# Patient Record
Sex: Female | Born: 1980 | Race: White | Hispanic: No | Marital: Married | Smoking: Former smoker
Health system: Southern US, Community
[De-identification: ages and names within clinical notes are randomized; demographics above are authoritative.]

## PROBLEM LIST (undated history)

## (undated) ENCOUNTER — Inpatient Hospital Stay (HOSPITAL_COMMUNITY): Payer: Self-pay

## (undated) DIAGNOSIS — N83209 Unspecified ovarian cyst, unspecified side: Secondary | ICD-10-CM

## (undated) DIAGNOSIS — D219 Benign neoplasm of connective and other soft tissue, unspecified: Secondary | ICD-10-CM

## (undated) HISTORY — PX: APPENDECTOMY: SHX54

---
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
## 1999-11-21 ENCOUNTER — Other Ambulatory Visit: Admission: RE | Admit: 1999-11-21 | Discharge: 1999-11-21 | Payer: Self-pay | Admitting: Gynecology

## 2000-12-25 ENCOUNTER — Other Ambulatory Visit: Admission: RE | Admit: 2000-12-25 | Discharge: 2000-12-25 | Payer: Self-pay | Admitting: Gynecology

## 2001-08-23 ENCOUNTER — Encounter: Payer: Self-pay | Admitting: *Deleted

## 2001-08-23 ENCOUNTER — Ambulatory Visit (HOSPITAL_COMMUNITY): Admission: RE | Admit: 2001-08-23 | Discharge: 2001-08-23 | Payer: Self-pay | Admitting: *Deleted

## 2001-12-31 ENCOUNTER — Other Ambulatory Visit: Admission: RE | Admit: 2001-12-31 | Discharge: 2001-12-31 | Payer: Self-pay | Admitting: Gynecology

## 2003-01-20 ENCOUNTER — Other Ambulatory Visit: Admission: RE | Admit: 2003-01-20 | Discharge: 2003-01-20 | Payer: Self-pay | Admitting: Gynecology

## 2004-09-23 ENCOUNTER — Other Ambulatory Visit: Admission: RE | Admit: 2004-09-23 | Discharge: 2004-09-23 | Payer: Self-pay | Admitting: Gynecology

## 2005-08-12 ENCOUNTER — Other Ambulatory Visit: Admission: RE | Admit: 2005-08-12 | Discharge: 2005-08-12 | Payer: Self-pay | Admitting: Gynecology

## 2006-02-17 ENCOUNTER — Inpatient Hospital Stay (HOSPITAL_COMMUNITY): Admission: RE | Admit: 2006-02-17 | Discharge: 2006-02-20 | Payer: Self-pay | Admitting: Gynecology

## 2006-03-17 ENCOUNTER — Other Ambulatory Visit: Admission: RE | Admit: 2006-03-17 | Discharge: 2006-03-17 | Payer: Self-pay | Admitting: Family Medicine

## 2007-08-30 ENCOUNTER — Other Ambulatory Visit: Admission: RE | Admit: 2007-08-30 | Discharge: 2007-08-30 | Payer: Self-pay | Admitting: Family Medicine

## 2008-02-18 ENCOUNTER — Ambulatory Visit: Payer: Self-pay | Admitting: Gynecology

## 2008-02-21 ENCOUNTER — Ambulatory Visit: Payer: Self-pay | Admitting: Gynecology

## 2008-03-10 ENCOUNTER — Ambulatory Visit: Payer: Self-pay | Admitting: Gynecology

## 2008-04-10 ENCOUNTER — Ambulatory Visit: Payer: Self-pay | Admitting: Gynecology

## 2009-04-26 ENCOUNTER — Other Ambulatory Visit: Admission: RE | Admit: 2009-04-26 | Discharge: 2009-04-26 | Payer: Self-pay | Admitting: Gynecology

## 2009-04-26 ENCOUNTER — Ambulatory Visit: Payer: Self-pay | Admitting: Gynecology

## 2009-05-04 ENCOUNTER — Ambulatory Visit: Payer: Self-pay | Admitting: Gynecology

## 2009-05-08 ENCOUNTER — Ambulatory Visit: Payer: Self-pay | Admitting: Gynecology

## 2009-06-04 ENCOUNTER — Ambulatory Visit: Payer: Self-pay | Admitting: Gynecology

## 2009-09-05 ENCOUNTER — Ambulatory Visit: Payer: Self-pay | Admitting: Gynecology

## 2009-10-08 ENCOUNTER — Ambulatory Visit: Payer: Self-pay | Admitting: Gynecology

## 2009-10-12 ENCOUNTER — Ambulatory Visit: Payer: Self-pay | Admitting: Gynecology

## 2009-12-11 HISTORY — PX: INTRAUTERINE DEVICE INSERTION: SHX323

## 2009-12-20 ENCOUNTER — Ambulatory Visit: Payer: Self-pay | Admitting: Gynecology

## 2010-01-21 ENCOUNTER — Ambulatory Visit: Payer: Self-pay | Admitting: Gynecology

## 2010-06-28 NOTE — Discharge Summary (Signed)
Chelsea Lam, Chelsea Lam             ACCOUNT NO.:  [id number]   MEDICAL RECORD NO.:  [id number]          PATIENT TYPE:  INP   LOCATION:  9135                          FACILITY:  [facility]   PHYSICIAN:  Juan H. Lily Peer, M.D.DATE OF BIRTH:  1980/08/18   DATE OF ADMISSION:  02/17/2006  DATE OF DISCHARGE:  02/20/2006                               DISCHARGE SUMMARY   TOTAL DAYS HOSPITALIZED:  Three.   HISTORY:  The patient is a 30 year old gravida 1, para 0 at 39-1/2 weeks  estimated gestational age, who had been admitted to the hospital  secondary to suspicious for LGA baby.  The patient had been on Pitocin  all day with no change in her cervix and the head was not engaged and  suspected cephalopelvic disproportion and also, she had a reassuring  fetal heart rate tracing and was taken for primary cesarean section by  Dr. Ok Edwards.  The patient delivered a viable female infant via  primary lower uterine segment transverse cesarean section.  She  delivered a viable female infant, Apgars of 8 and 9 with a weight of 8  pounds 13 ounces and arterial cord pH of 7.19.  The patient had a small  right lower uterine segment hematoma that was contained during the  operation and did well.  On postoperative day #1, her hemoglobin was  8.9, hematocrit 25%, platelet count 174,000.  She is O positive, rubella  immune.  She was passing flatus.  Her Foley catheter was discontinued  and she was started on oral Percocet and her diet was advanced from  clear to a regular diet.  She continued to ambulate and do well and was  ready to be discharged home on her 3rd postoperative day.  She was  passing flatus and tolerating a regular diet.   FINAL DIAGNOSES:  1. Term intrauterine pregnancy, delivered.  2. Cephalopelvic disproportion.  3. Failed induction.  4. Anemia.   PROCEDURES PERFORMED:  1. Induction via Cervidil and Pitocin.  2. Primary lower uterine segment transverse cesarean section.   FINAL  DISPOSITION AND FOLLOWUP:  The patient was discharged home on her  3rd postoperative day.  She was up and ambulating and tolerating a  regular diet well.  Her incision was intact.  The staples were to be  removed and incision steri-stripped.  She will be discharged home on  prenatal vitamins and iron as well as a prescription for Tylox to take  one p.o. q.4-6 h. p.r.n. pain and discharge instruction were provided.  She is to follow up in the office in 4 weeks for a postpartum visit.      Juan H. Lily Peer, M.D.  Electronically Signed     JHF/MEDQ  D:  02/20/2006  T:  02/20/2006  Job:  [id number]

## 2010-06-28 NOTE — Op Note (Signed)
NAMEMCKENLEE, MANGHAM             ACCOUNT NO.:  [id number]   MEDICAL RECORD NO.:  [id number]          PATIENT TYPE:  INP   LOCATION:  9135                          FACILITY:  [facility]   PHYSICIAN:  Juan H. Lily Peer, M.D.DATE OF BIRTH:  1981/01/05   DATE OF PROCEDURE:  02/17/2006  DATE OF DISCHARGE:                               OPERATIVE REPORT   SURGEON:  Juan H. Lily Peer, M.D.   INDICATIONS FOR OPERATION:  30 year old gravida 1, para 0, at 27 1/2  weeks' estimated gestational age was admitted for induction secondary to  LGA baby. The patient was with Pitocin all day long with no change in  her cervix, long and closed, head not engaged, suspected cephalopelvic  disproportion, reassuring fetal heart rate tracing.   PREOPERATIVE DIAGNOSIS:  1. Term intrauterine pregnancy.  2. Suspected fetal macrosomia.  3. Failed induction.  4. Cephalopelvic disproportion.   POSTOPERATIVE DIAGNOSIS:  1. Term intrauterine pregnancy.  2. Suspected fetal macrosomia.  3. Failed induction.  4. Cephalopelvic disproportion.   ANESTHESIA:  Epidural.   PROCEDURE PERFORMED:  Primary lower uterine segment transverse cesarean  section.   FINDINGS:  Viable female infant, Apgars of 8/9, with a weight of 8 pounds  13 ounces.  Arterial cord pH of 7.19.   COMPLICATIONS:  Right lower uterine segment hematoma.   DESCRIPTION OF OPERATION:  After the patient adequately counseled, she  was taken to the operating room where she underwent placement of an  epidural catheter.  She was in the supine position and the abdomen  prepped and draped in the usual sterile fashion.  A Foley catheter was  inserted to monitor urinary output.  After the drapes were in place, a  Pfannenstiel skin incision was made 2 cm above the symphysis pubis.  Incision was carried out through the skin, subcutaneous tissue, down to  the rectus fascia whereby a midline nick was made.  The fascia was  incised in a transverse fashion.  The  peritoneal cavity was entered  cautiously and the lower uterine segment was incised in a transverse  fashion.  Clear amniotic fluid was present.  The newborn was delivered.  The cord was doubly clamped and excised.  The nasopharyngeal area was  bulb suctioned.  The newborn was passed off to the neonatologist after  being shown to the parents. The newborn gave immediate cry.  Apgars were  8/9, weight 8 pounds 13 ounces.  Arterial cord pH 7.19. Normal maternal  pelvic anatomy.  Pitocin drip was started.  The patient received a gram  of cefoxitin IV for prophylaxis.  The uterus was exteriorized.  The  intrauterine cavity was swept clear of remaining products of conception.  The lower uterine segment transverse incision was closed in a locking  stitch manner followed by an imbricating manner with 0 Vicryl suture.  A  right lower uterine segment hematoma was present and direct pressure for  approximately ten minutes was applied to the area and was contained. In  addition to the transverse incision, Surgicel was placed for additional  hemostasis.  The uterus, tubes, and ovaries were normal.  The uterus was  placed back in the pelvic cavity.  Of note, prior to placement of the  Surgicel, the pelvic cavity was copiously irrigated with normal saline  solution and once again, it was ascertained there was adequate  hemostasis and the hematoma was not expanding.  The visceral peritoneum  was not reapproximated but the rectus fascia was closed with a running  stitch of 0 Vicryl suture.  The subcutaneous bleeders were Bovie  cauterized.  The skin was reapproximated with skin clips followed by  placing Xeroform gauze and 4 by 4 dressing.  The patient was transferred  to the recovery room with stable vital signs.  Blood loss from the  procedure was 1 liter. IV fluids 3800 mL lactated Ringer's.  Urine  output 400 mL.      Juan H. Lily Peer, M.D.  Electronically Signed     JHF/MEDQ  D:  02/17/2006   T:  02/17/2006  Job:  [id number]

## 2010-08-16 ENCOUNTER — Encounter: Payer: Self-pay | Admitting: *Deleted

## 2010-12-09 ENCOUNTER — Ambulatory Visit (INDEPENDENT_AMBULATORY_CARE_PROVIDER_SITE_OTHER): Payer: PRIVATE HEALTH INSURANCE | Admitting: Gynecology

## 2010-12-09 ENCOUNTER — Encounter: Payer: Self-pay | Admitting: Gynecology

## 2010-12-09 DIAGNOSIS — A499 Bacterial infection, unspecified: Secondary | ICD-10-CM

## 2010-12-09 DIAGNOSIS — Z30431 Encounter for routine checking of intrauterine contraceptive device: Secondary | ICD-10-CM

## 2010-12-09 DIAGNOSIS — N938 Other specified abnormal uterine and vaginal bleeding: Secondary | ICD-10-CM

## 2010-12-09 DIAGNOSIS — N760 Acute vaginitis: Secondary | ICD-10-CM

## 2010-12-09 DIAGNOSIS — N898 Other specified noninflammatory disorders of vagina: Secondary | ICD-10-CM

## 2010-12-09 DIAGNOSIS — B3731 Acute candidiasis of vulva and vagina: Secondary | ICD-10-CM

## 2010-12-09 DIAGNOSIS — B373 Candidiasis of vulva and vagina: Secondary | ICD-10-CM

## 2010-12-09 DIAGNOSIS — N949 Unspecified condition associated with female genital organs and menstrual cycle: Secondary | ICD-10-CM

## 2010-12-09 DIAGNOSIS — B9689 Other specified bacterial agents as the cause of diseases classified elsewhere: Secondary | ICD-10-CM

## 2010-12-09 MED ORDER — METRONIDAZOLE 500 MG PO TABS: 500.0000 mg | ORAL_TABLET | Freq: Two times a day (BID) | ORAL | Status: AC

## 2010-12-09 MED ORDER — FLUCONAZOLE 150 MG PO TABS: 150.0000 mg | ORAL_TABLET | Freq: Once | ORAL | Status: AC

## 2010-12-09 NOTE — Progress Notes (Signed)
Patient presents complaining of vaginal discharge with odor. She's not sure if she had left a tampon in place. She has IUD notes she's had 2 light menses this past cycle which is unusual normally has just one light menses per month. Not having pain fevers or other symptoms.  Exam Abdomen soft nontender masses guarding rebound organomegaly Pelvic external BUS vagina with abundant white discharge, cervix normal IUD string visualized no evidence of retained tampon uterus normal size midline mobile nontender adnexa without masses or tenderness  Assessment and plan: KOH wet prep is positive for yeast and BV. No evidence of retained tampon. We'll treat with Diflucan 150x1 dose and Flagyl 500 twice a day x7 days the avoidance reviewed. Will keep menstrual calendar as long as resumes regular menses we'll follow if persistent abnormal bleeding or other symptoms or recurrence of her vaginal discharge she'll represent for further evaluation.

## 2010-12-24 ENCOUNTER — Ambulatory Visit (INDEPENDENT_AMBULATORY_CARE_PROVIDER_SITE_OTHER): Payer: PRIVATE HEALTH INSURANCE | Admitting: Gynecology

## 2010-12-24 ENCOUNTER — Encounter: Payer: Self-pay | Admitting: Gynecology

## 2010-12-24 ENCOUNTER — Other Ambulatory Visit: Payer: Self-pay | Admitting: Gynecology

## 2010-12-24 ENCOUNTER — Ambulatory Visit (INDEPENDENT_AMBULATORY_CARE_PROVIDER_SITE_OTHER): Payer: PRIVATE HEALTH INSURANCE

## 2010-12-24 VITALS — BP 128/78

## 2010-12-24 DIAGNOSIS — R102 Pelvic and perineal pain unspecified side: Secondary | ICD-10-CM

## 2010-12-24 DIAGNOSIS — N938 Other specified abnormal uterine and vaginal bleeding: Secondary | ICD-10-CM

## 2010-12-24 DIAGNOSIS — N949 Unspecified condition associated with female genital organs and menstrual cycle: Secondary | ICD-10-CM

## 2010-12-24 DIAGNOSIS — B373 Candidiasis of vulva and vagina: Secondary | ICD-10-CM

## 2010-12-24 DIAGNOSIS — T8339XA Other mechanical complication of intrauterine contraceptive device, initial encounter: Secondary | ICD-10-CM

## 2010-12-24 DIAGNOSIS — IMO0001 Reserved for inherently not codable concepts without codable children: Secondary | ICD-10-CM

## 2010-12-24 DIAGNOSIS — IMO0002 Reserved for concepts with insufficient information to code with codable children: Secondary | ICD-10-CM

## 2010-12-24 DIAGNOSIS — N921 Excessive and frequent menstruation with irregular cycle: Secondary | ICD-10-CM

## 2010-12-24 DIAGNOSIS — N83209 Unspecified ovarian cyst, unspecified side: Secondary | ICD-10-CM

## 2010-12-24 DIAGNOSIS — D3910 Neoplasm of uncertain behavior of unspecified ovary: Secondary | ICD-10-CM

## 2010-12-24 DIAGNOSIS — N83201 Unspecified ovarian cyst, right side: Secondary | ICD-10-CM

## 2010-12-24 DIAGNOSIS — Z113 Encounter for screening for infections with a predominantly sexual mode of transmission: Secondary | ICD-10-CM

## 2010-12-24 DIAGNOSIS — N941 Unspecified dyspareunia: Secondary | ICD-10-CM

## 2010-12-24 DIAGNOSIS — Z975 Presence of (intrauterine) contraceptive device: Secondary | ICD-10-CM

## 2010-12-24 DIAGNOSIS — R1903 Right lower quadrant abdominal swelling, mass and lump: Secondary | ICD-10-CM

## 2010-12-24 DIAGNOSIS — B3731 Acute candidiasis of vulva and vagina: Secondary | ICD-10-CM

## 2010-12-24 DIAGNOSIS — N925 Other specified irregular menstruation: Secondary | ICD-10-CM

## 2010-12-24 DIAGNOSIS — N898 Other specified noninflammatory disorders of vagina: Secondary | ICD-10-CM

## 2010-12-24 MED ORDER — TERCONAZOLE 0.8 % VA CREA: 1.0000 | TOPICAL_CREAM | Freq: Every day | VAGINAL | Status: AC

## 2010-12-24 MED ORDER — MEGESTROL ACETATE 40 MG PO TABS: 40.0000 mg | ORAL_TABLET | Freq: Two times a day (BID) | ORAL | Status: AC
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
## 2013-02-25 LAB — OB RESULTS CONSOLE ABO/RH: RH Type: POSITIVE

## 2013-05-27 ENCOUNTER — Encounter: Payer: Self-pay | Admitting: Women's Health

## 2013-05-27 ENCOUNTER — Ambulatory Visit (INDEPENDENT_AMBULATORY_CARE_PROVIDER_SITE_OTHER): Payer: No Typology Code available for payment source | Admitting: Women's Health

## 2013-05-27 DIAGNOSIS — Z309 Encounter for contraceptive management, unspecified: Secondary | ICD-10-CM

## 2013-05-27 DIAGNOSIS — Z113 Encounter for screening for infections with a predominantly sexual mode of transmission: Secondary | ICD-10-CM

## 2013-05-27 DIAGNOSIS — IMO0001 Reserved for inherently not codable concepts without codable children: Secondary | ICD-10-CM

## 2013-05-27 MED ORDER — NORETHIN ACE-ETH ESTRAD-FE 1-20 MG-MCG PO TABS: 1.0000 | ORAL_TABLET | Freq: Every day | ORAL | Status: DC

## 2013-05-27 NOTE — Addendum Note (Signed)
Addended by: Thamas Jaegers on: 05/27/2013 02:44 PM   Modules accepted: Orders

## 2013-05-27 NOTE — Progress Notes (Signed)
Patient ID: Mateo Flow L. Langone, female   DOB: Sep 13, 1980, 33 y.o.   MRN: [id number] Presents requesting IUD to be removed, placed in 2011. Irregular light bleeding since placed. States has had bleeding since 05/11/2013 several times heavy gushing of blood changing pad and tampon every hour. Denies any vaginal discharge, abdominal pain, fever, or urinary symptoms. New partner. Would like to start on birth control pills. Had been on in the past without problem. Nonsmoker. Overdue for annual, history of normal Paps. States had a negative  UPT x2. States has not been in, loss of insurance.  Exam: Appears well.  Abdomen soft nontender no rebound or radiation of pain.  External genitalia within normal limits.Marland Kitchen Speculum exam IUD strings visible, no visible blood, GC/Chlamydia culture taken. IUD string grasped with ring forceps removed intact, shown to patient, discarded. Bimanual, no CMT or adnexal fullness or tenderness.  Irregular bleeding with Mirena IUD Contraception management  Plan:  Conception options reviewed. Will try Loestrin 1/20 prescription, proper use, slight risk for blood clots and strokes reviewed. 1 refill only, instructed to schedule annual exam and will complete STD screen. Condoms encouraged first month.  Instructed to call if heavy bleeding reoccurs. GC Chlamydia culture pending.

## 2013-05-28 LAB — GC/CHLAMYDIA PROBE AMP
CT PROBE, AMP APTIMA: NEGATIVE
GC PROBE AMP APTIMA: NEGATIVE

## 2013-05-29 ENCOUNTER — Inpatient Hospital Stay (HOSPITAL_COMMUNITY)
Admission: AD | Admit: 2013-05-29 | Discharge: 2013-05-29 | Disposition: A | Discharge status: Home / Self Care | Payer: No Typology Code available for payment source | Source: Ambulatory Visit | Attending: Obstetrics and Gynecology | Admitting: Obstetrics and Gynecology

## 2013-05-29 ENCOUNTER — Inpatient Hospital Stay (HOSPITAL_COMMUNITY): Payer: No Typology Code available for payment source

## 2013-05-29 ENCOUNTER — Encounter (HOSPITAL_COMMUNITY): Payer: Self-pay | Admitting: Family

## 2013-05-29 DIAGNOSIS — Z87891 Personal history of nicotine dependence: Secondary | ICD-10-CM | POA: Insufficient documentation

## 2013-05-29 DIAGNOSIS — N938 Other specified abnormal uterine and vaginal bleeding: Secondary | ICD-10-CM | POA: Insufficient documentation

## 2013-05-29 DIAGNOSIS — N949 Unspecified condition associated with female genital organs and menstrual cycle: Secondary | ICD-10-CM | POA: Insufficient documentation

## 2013-05-29 DIAGNOSIS — N898 Other specified noninflammatory disorders of vagina: Secondary | ICD-10-CM

## 2013-05-29 DIAGNOSIS — N939 Abnormal uterine and vaginal bleeding, unspecified: Secondary | ICD-10-CM

## 2013-05-29 DIAGNOSIS — D250 Submucous leiomyoma of uterus: Secondary | ICD-10-CM

## 2013-05-29 DIAGNOSIS — N925 Other specified irregular menstruation: Secondary | ICD-10-CM | POA: Insufficient documentation

## 2013-05-29 LAB — CBC
HEMATOCRIT: 36.9 % (ref 36.0–46.0)
Hemoglobin: 12.9 g/dL (ref 12.0–15.0)
MCH: 33.5 pg (ref 26.0–34.0)
MCHC: 35 g/dL (ref 30.0–36.0)
MCV: 95.8 fL (ref 78.0–100.0)
Platelets: 208 10*3/uL (ref 150–400)
RBC: 3.85 MIL/uL — ABNORMAL LOW (ref 3.87–5.11)
RDW: 12.6 % (ref 11.5–15.5)
WBC: 6 10*3/uL (ref 4.0–10.5)

## 2013-05-29 LAB — URINALYSIS, ROUTINE W REFLEX MICROSCOPIC
BILIRUBIN URINE: NEGATIVE
Glucose, UA: NEGATIVE mg/dL
Ketones, ur: NEGATIVE mg/dL
Leukocytes, UA: NEGATIVE
Nitrite: NEGATIVE
Protein, ur: NEGATIVE mg/dL
Specific Gravity, Urine: 1.01 (ref 1.005–1.030)
UROBILINOGEN UA: 0.2 mg/dL (ref 0.0–1.0)
pH: 5.5 (ref 5.0–8.0)

## 2013-05-29 LAB — URINE MICROSCOPIC-ADD ON

## 2013-05-29 LAB — POCT PREGNANCY, URINE: Preg Test, Ur: NEGATIVE

## 2013-05-29 MED ORDER — KETOROLAC TROMETHAMINE 60 MG/2ML IM SOLN
60.0000 mg | Freq: Once | INTRAMUSCULAR | Status: AC
  Administered 2013-05-29: 60 mg via INTRAMUSCULAR
  Filled 2013-05-29: qty 2

## 2013-05-29 NOTE — Discharge Instructions (Signed)
Abnormal Uterine Bleeding °Abnormal uterine bleeding means bleeding from the vagina that is not your normal menstrual period. This can be: °· Bleeding or spotting between periods. °· Bleeding after sex (sexual intercourse). °· Bleeding that is heavier or more than normal. °· Periods that last longer than usual. °· Bleeding after menopause. °There are many problems that may cause this. Treatment will depend on the cause of the bleeding. Any kind of bleeding that is not normal should be reviewed by your doctor.  °HOME CARE °Watch your condition for any changes. These actions may lessen any discomfort you are having: °· Do not use tampons or douches as told by your doctor. °· Change your pads often. °You should get regular pelvic exams and Pap tests. Keep all appointments for tests as told by your doctor. °GET HELP IF: °· You are bleeding for more than 1 week. °· You feel dizzy at times. °GET HELP RIGHT AWAY IF:  °· You pass out. °· You have to change pads every 15 to 30 minutes. °· You have belly pain. °· You have a fever. °· You become sweaty or weak. °· You are passing large blood clots from the vagina. °· You feel sick to your stomach (nauseous) and throw up (vomit). °MAKE SURE YOU: °· Understand these instructions. °· Will watch your condition. °· Will get help right away if you are not doing well or get worse. °Document Released: 11/24/2008 Document Revised: 11/17/2012 Document Reviewed: 08/26/2012 °ExitCare® Patient Information ©2014 [employer]. ° °Fibroids °Fibroids are lumps (tumors) that can occur any place in a woman's body. These lumps are not cancerous. Fibroids vary in size, weight, and where they grow. °HOME CARE °· Do not take aspirin. °· Write down the number of pads or tampons you use during your period. Tell your doctor. This can help determine the best treatment for you. °GET HELP RIGHT AWAY IF: °· You have pain in your lower belly (abdomen) that is not helped with medicine. °· You have cramps  that are not helped with medicine. °· You have more bleeding between or during your period. °· You feel lightheaded or pass out (faint). °· Your lower belly pain gets worse. °MAKE SURE YOU: °· Understand these instructions. °· Will watch your condition. °· Will get help right away if you are not doing well or get worse. °Document Released: 03/01/2010 Document Revised: 04/21/2011 Document Reviewed: 03/01/2010 °Exit[employer]® Patient Information ©2014 [employer]. ° °

## 2013-05-29 NOTE — MAU Provider Note (Signed)
History     CSN: [id number]  Arrival date and time: 05/29/13 1345   First Provider Initiated Contact with Patient 05/29/13 1625      Chief Complaint  Patient presents with  . Vaginal Bleeding   HPI  [name] is a 33 y.o. female who presents with heavy vaginal bleeding. She has been bleeding since April 1st; she was last seen in the Dr. Gabriel Lam on Friday. She had her mirena IUD removed at that time. The patient was instructed to come to MAU if bleeding/pain became worse. She is bleeding heavily and is changing several pads per day.   OB History   Grav Para Term Preterm Abortions TAB SAB Ect Mult Living                  History reviewed. No pertinent past medical history.  Past Surgical History  Procedure Laterality Date  . Cesarean section  2008  . Knee reconstruction, medial patellar femoral ligament  1999  . Appendectomy    . Intrauterine device insertion  12/2009    mirena    Family History  Problem Relation Age of Onset  . Hypertension Mother     History  Substance Use Topics  . Smoking status: Former Research scientist (life sciences)  . Smokeless tobacco: Not on file  . Alcohol Use:     Allergies:  Allergies  Allergen Reactions  . Tetracyclines & Related Hives and Rash    Prescriptions prior to admission  Medication Sig Dispense Refill  . clonazePAM (KLONOPIN) 2 MG tablet Take 2 mg by mouth 2 (two) times daily as needed for anxiety.      . norethindrone-ethinyl estradiol (JUNEL FE,GILDESS FE,LOESTRIN FE) 1-20 MG-MCG tablet Take 1 tablet by mouth daily.  1 Package  2  . VYVANSE 50 MG capsule Take 50 mg by mouth daily.       Results for orders placed during the hospital encounter of 05/29/13 (from the past 48 hour(s))  CBC     Status: Abnormal   Collection Time    05/29/13  2:00 PM      Result Value Ref Range   WBC 6.0  4.0 - 10.5 K/uL   RBC 3.85 (*) 3.87 - 5.11 MIL/uL   Hemoglobin 12.9  12.0 - 15.0 g/dL   HCT 36.9  36.0 - 46.0 %   MCV 95.8  78.0 - 100.0 fL    MCH 33.5  26.0 - 34.0 pg   MCHC 35.0  30.0 - 36.0 g/dL   RDW 12.6  11.5 - 15.5 %   Platelets 208  150 - 400 K/uL  URINALYSIS, ROUTINE W REFLEX MICROSCOPIC     Status: Abnormal   Collection Time    05/29/13  2:10 PM      Result Value Ref Range   Color, Urine YELLOW  YELLOW   APPearance CLEAR  CLEAR   Specific Gravity, Urine 1.010  1.005 - 1.030   pH 5.5  5.0 - 8.0   Glucose, UA NEGATIVE  NEGATIVE mg/dL   Hgb urine dipstick SMALL (*) NEGATIVE   Bilirubin Urine NEGATIVE  NEGATIVE   Ketones, ur NEGATIVE  NEGATIVE mg/dL   Protein, ur NEGATIVE  NEGATIVE mg/dL   Urobilinogen, UA 0.2  0.0 - 1.0 mg/dL   Nitrite NEGATIVE  NEGATIVE   Leukocytes, UA NEGATIVE  NEGATIVE  URINE MICROSCOPIC-ADD ON     Status: Abnormal   Collection Time    05/29/13  2:10 PM      Result Value Ref  Range   Squamous Epithelial / LPF FEW (*) RARE   WBC, UA 0-2  <3 WBC/hpf   RBC / HPF 0-2  <3 RBC/hpf   Bacteria, UA FEW (*) RARE  POCT PREGNANCY, URINE     Status: None   Collection Time    05/29/13  2:47 PM      Result Value Ref Range   Preg Test, Ur NEGATIVE  NEGATIVE   Comment:            THE SENSITIVITY OF THIS     METHODOLOGY IS >24 mIU/mL   US Transvaginal Non-ob  05/29/2013   CLINICAL DATA:  Dysfunctional uterine bleeding. Recent IUD removal 2 days ago.  EXAM: TRANSABDOMINAL AND TRANSVAGINAL ULTRASOUND OF PELVIS  TECHNIQUE: Both transabdominal and transvaginal ultrasound examinations of the pelvis were performed. Transabdominal technique was performed for global imaging of the pelvis including uterus, ovaries, adnexal regions, and pelvic cul-de-sac. It was necessary to proceed with endovaginal exam following the transabdominal exam to visualize the ovaries and endometrium.  COMPARISON:  No priors.  FINDINGS: Uterus  Measurements: 9.6 x 4.9 x 5.6 cm. In the fundal portion of the uterus extending into the endometrial canal there is a 1.7 x 1.2 x 1.6 cm lesion of heterogeneous echotexture which is mixed hypoechoic  and isoechoic, most compatible with a submucosal fibroid.  Endometrium  Thickness: 16 mm. Small amount small amount of fluid in the endometrial canal.  Right ovary  Measurements: 3.5 x 1.8 x 1.8 cm. Normal appearance/no adnexal mass.  Left ovary  Measurements: 4.1 x 2.1 x 2.9 cm. Normal appearance/no adnexal mass.  Other findings  No free fluid.  IMPRESSION: 1. 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 2. Small volume of blood in the endometrial canal.   Electronically Signed   By: Vinnie Langton M.D.   On: 05/29/2013 16:16   US Pelvis Complete  05/29/2013   CLINICAL DATA:  Dysfunctional uterine bleeding. Recent IUD removal 2 days ago.  EXAM: TRANSABDOMINAL AND TRANSVAGINAL ULTRASOUND OF PELVIS  TECHNIQUE: Both transabdominal and transvaginal ultrasound examinations of the pelvis were performed. Transabdominal technique was performed for global imaging of the pelvis including uterus, ovaries, adnexal regions, and pelvic cul-de-sac. It was necessary to proceed with endovaginal exam following the transabdominal exam to visualize the ovaries and endometrium.  COMPARISON:  No priors.  FINDINGS: Uterus  Measurements: 9.6 x 4.9 x 5.6 cm. In the fundal portion of the uterus extending into the endometrial canal there is a 1.7 x 1.2 x 1.6 cm lesion of heterogeneous echotexture which is mixed hypoechoic and isoechoic, most compatible with a submucosal fibroid.  Endometrium  Thickness: 16 mm. Small amount small amount of fluid in the endometrial canal.  Right ovary  Measurements: 3.5 x 1.8 x 1.8 cm. Normal appearance/no adnexal mass.  Left ovary  Measurements: 4.1 x 2.1 x 2.9 cm. Normal appearance/no adnexal mass.  Other findings  No free fluid.  IMPRESSION: 1. 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 2. Small volume of blood in the endometrial canal.   Electronically Signed   By:  Vinnie Langton M.D.   On: 05/29/2013 16:16    Review of Systems  Constitutional: Negative for fever and chills.  Gastrointestinal: Positive for abdominal pain (abdominal cramping ).  Genitourinary: Negative for dysuria, urgency, frequency and hematuria.   Physical Exam  Blood pressure 114/69, pulse 68, temperature 98.1 F (36.7 C), resp. rate 18, height 5\' 1"  (1.549 m), weight 71.668 kg (158 lb), last menstrual period 05/11/2013.  Physical Exam  Constitutional: She is oriented to person, place, and time. She appears well-developed and well-nourished. No distress.  HENT:  Head: Normocephalic.  Eyes: Pupils are equal, round, and reactive to light.  Neck: Neck supple.  Respiratory: Effort normal.  GI: Soft. She exhibits no distension. There is no tenderness. There is no rebound.  Genitourinary:  Speculum exam: Vagina - Moderate amount of bright red blood in vaginal canal, no odor Cervix - Moderate bleeding from os  Bimanual exam: Cervix closed Uterus non tender, normal size Adnexa non tender, no masses bilaterally Chaperone present for exam.   Musculoskeletal: Normal range of motion.  Neurological: She is alert and oriented to person, place, and time.  Skin: Skin is warm. She is not diaphoretic.  Psychiatric: Her behavior is normal.    MAU Course  Procedures None  MDM CBC Pelvic US  Consulted with Dr. Quincy Simmonds; physical exam and [place] report discussed Toradol 60 mg given IVP  Assessment and Plan   A:  Abnormal vaginal bleeding Submucosal fibroid noted on [place]  P:  Discharge home in stable condition Pt is to call Dr. Thamas Jaegers tomorrow to schedule a follow up visit Continue birth control use; change dosing to 1 pill BID times 5 days then return to 1 pill po daily.  Bleeding precautions discussed Return to MAU as needed, if symptoms worsen  Darrelyn Hillock Rasch, NP 05/29/2013, 6:17 PM

## 2013-05-29 NOTE — MAU Note (Addendum)
33 yo, IUD (Mirena x 4 yrs) removed Friday after VB and cramping (7/10 pain) since 04/01. She tried taking expired oral birth contraception on Wednesday and Thursday; on Friday she began Loestrin.  She has used 16 tampons + 16 pads within last 24 hours.

## 2013-05-29 NOTE — MAU Note (Signed)
Pt presents with complaints of heavy vaginal bleeding since Friday. She states she had her IUD taken out on Friday because she has been having vaginal bleeding since the 1st of April.

## 2013-05-29 NOTE — Progress Notes (Signed)
Orders called in for pt, Midlevel provider to see pt and call w.results

## 2013-05-30 ENCOUNTER — Ambulatory Visit (INDEPENDENT_AMBULATORY_CARE_PROVIDER_SITE_OTHER): Payer: No Typology Code available for payment source | Admitting: Gynecology

## 2013-05-30 ENCOUNTER — Other Ambulatory Visit (HOSPITAL_COMMUNITY)
Admission: RE | Admit: 2013-05-30 | Discharge: 2013-05-30 | Disposition: A | Discharge status: Home / Self Care | Payer: No Typology Code available for payment source | Source: Ambulatory Visit | Attending: Gynecology | Admitting: Gynecology

## 2013-05-30 ENCOUNTER — Encounter: Payer: Self-pay | Admitting: Gynecology

## 2013-05-30 VITALS — BP 130/88

## 2013-05-30 DIAGNOSIS — Z1151 Encounter for screening for human papillomavirus (HPV): Secondary | ICD-10-CM | POA: Insufficient documentation

## 2013-05-30 DIAGNOSIS — D259 Leiomyoma of uterus, unspecified: Secondary | ICD-10-CM | POA: Insufficient documentation

## 2013-05-30 DIAGNOSIS — Z01419 Encounter for gynecological examination (general) (routine) without abnormal findings: Secondary | ICD-10-CM | POA: Insufficient documentation

## 2013-05-30 DIAGNOSIS — N949 Unspecified condition associated with female genital organs and menstrual cycle: Secondary | ICD-10-CM

## 2013-05-30 DIAGNOSIS — N925 Other specified irregular menstruation: Secondary | ICD-10-CM

## 2013-05-30 DIAGNOSIS — N938 Other specified abnormal uterine and vaginal bleeding: Secondary | ICD-10-CM

## 2013-05-30 DIAGNOSIS — Z124 Encounter for screening for malignant neoplasm of cervix: Secondary | ICD-10-CM

## 2013-05-30 LAB — PROLACTIN: Prolactin: 3.3 ng/mL

## 2013-05-30 LAB — TSH: TSH: 0.764 u[IU]/mL (ref 0.350–4.500)

## 2013-05-30 MED ORDER — MEGESTROL ACETATE 40 MG PO TABS: 40.0000 mg | ORAL_TABLET | Freq: Two times a day (BID) | ORAL | Status: DC

## 2013-05-30 NOTE — Progress Notes (Signed)
   Patient is a 33 year old gravida 1 para 1 (R. cesarean section several years ago) was seen in the office April 17 by our nurse practitioner as a result of her wishing to have her Mirena IUD removed which had been placed several years ago and patient was having heavy bleeding requiring changing tampons hourly. She was then started on Loestrin 1-20 oral contraceptive pill. She then presented to the emergency room on April 19 with continued vaginal bleeding had a normal CBC and recently had a normal GC and chlamydia culture. Of note platelet count was normal.  Patient had abdominal pelvic ultrasound on 05/29/2013 and emergency room at [facility] with the following findings noted:  Uterus Measurements: 9.6 x 4.9 x 5.6 cm. In the fundal portion of the uterus extending into the endometrial canal there is a 1.7 x 1.2 x 1.6 cm lesion of heterogeneous echotexture which is mixed hypoechoic and isoechoic, most compatible with a submucosal fibroid. Endometrium Thickness: 16 mm. Small amount small amount of fluid in the endometrial canal. Right ovary Measurements: 3.5 x 1.8 x 1.8 cm. Normal appearance/no adnexal mass. Left ovary Measurements: 4.1 x 2.1 x 2.9 cm. Normal appearance/no adnexal mass. Other findings No free fluid. IMPRESSION: 1. 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 2. Small volume of blood in the endometrial canal.   She was instructed to continue the OCP twice a day for 5 days to stop her bleeding and followup in the office she had today.  Patient has been seen in the office for several years due to lack of insurance. On exam today: Abdomen soft nontender no rebound or guarding Pelvic: Bartholin urethra Skene was within normal limits Vagina: Very little blood noted in the vaginal vault: Cervix: No active bleeding Uterus: Uterus upper limits of normal no palpable mass or tenderness Adnexa: No palpable masses or  tenderness Rectal exam: Not done   Assessment/plan: It appears patient may have an underlying submucous myoma or submucous polyp contributing to her bleeding. She is going to discontinue the oral contraceptive pill today. She will use barrier contraception. She'll be started on Megace 40 mg twice a day for the next 10-15 days. She'll return back to the office in her later this week or next week for a sonohysterogram for better assessment of her intrauterine cavity. A TSH and prolactin will be drawn today.

## 2013-05-30 NOTE — MAU Provider Note (Signed)
Encounter reviewed by Dr. Brook Silva.  

## 2013-06-06 ENCOUNTER — Other Ambulatory Visit: Payer: Self-pay | Admitting: Gynecology

## 2013-06-07 ENCOUNTER — Other Ambulatory Visit: Payer: Self-pay | Admitting: Gynecology

## 2013-06-07 DIAGNOSIS — N938 Other specified abnormal uterine and vaginal bleeding: Secondary | ICD-10-CM

## 2013-06-07 DIAGNOSIS — D259 Leiomyoma of uterus, unspecified: Secondary | ICD-10-CM

## 2013-06-09 ENCOUNTER — Ambulatory Visit (INDEPENDENT_AMBULATORY_CARE_PROVIDER_SITE_OTHER): Payer: No Typology Code available for payment source

## 2013-06-09 ENCOUNTER — Other Ambulatory Visit: Payer: Self-pay | Admitting: Gynecology

## 2013-06-09 ENCOUNTER — Ambulatory Visit (INDEPENDENT_AMBULATORY_CARE_PROVIDER_SITE_OTHER): Payer: No Typology Code available for payment source | Admitting: Gynecology

## 2013-06-09 DIAGNOSIS — D250 Submucous leiomyoma of uterus: Secondary | ICD-10-CM

## 2013-06-09 DIAGNOSIS — N857 Hematometra: Secondary | ICD-10-CM

## 2013-06-09 DIAGNOSIS — N938 Other specified abnormal uterine and vaginal bleeding: Secondary | ICD-10-CM

## 2013-06-09 DIAGNOSIS — D259 Leiomyoma of uterus, unspecified: Secondary | ICD-10-CM

## 2013-06-09 DIAGNOSIS — N949 Unspecified condition associated with female genital organs and menstrual cycle: Secondary | ICD-10-CM

## 2013-06-09 DIAGNOSIS — N920 Excessive and frequent menstruation with regular cycle: Secondary | ICD-10-CM

## 2013-06-09 NOTE — Patient Instructions (Signed)
Hysteroscopy °Hysteroscopy is a procedure used for looking inside the womb (uterus). It may be done for various reasons, including: °· To evaluate abnormal bleeding, fibroid (benign, noncancerous) tumors, polyps, scar tissue (adhesions), and possibly cancer of the uterus. °· To look for lumps (tumors) and other uterine growths. °· To look for causes of why a woman cannot get pregnant (infertility), causes of recurrent loss of pregnancy (miscarriages), or a lost intrauterine device (IUD). °· To perform a sterilization by blocking the fallopian tubes from inside the uterus. °In this procedure, a thin, flexible tube with a tiny light and camera on the end of it (hysteroscope) is used to look inside the uterus. A hysteroscopy should be done right after a menstrual period to be sure you are not pregnant. °LET YOUR HEALTH CARE PROVIDER KNOW ABOUT:  °· Any allergies you have. °· All medicines you are taking, including vitamins, herbs, eye drops, creams, and over-the-counter medicines. °· Previous problems you or members of your family have had with the use of anesthetics. °· Any blood disorders you have. °· Previous surgeries you have had. °· Medical conditions you have. °RISKS AND COMPLICATIONS  °Generally, this is a safe procedure. However, as with any procedure, complications can occur. Possible complications include: °· Putting a hole in the uterus. °· Excessive bleeding. °· Infection. °· Damage to the cervix. °· Injury to other organs. °· Allergic reaction to medicines. °· Too much fluid used in the uterus for the procedure. °BEFORE THE PROCEDURE  °· Ask your health care provider about changing or stopping any regular medicines. °· Do not take aspirin or blood thinners for 1 week before the procedure, or as directed by your health care provider. These can cause bleeding. °· If you smoke, do not smoke for 2 weeks before the procedure. °· In some cases, a medicine is placed in the cervix the day before the procedure.  This medicine makes the cervix have a larger opening (dilate). This makes it easier for the instrument to be inserted into the uterus during the procedure. °· Do not eat or drink anything for at least 8 hours before the surgery. °· Arrange for someone to take you home after the procedure. °PROCEDURE  °· You may be given a medicine to relax you (sedative). You may also be given one of the following: °· A medicine that numbs the area around the cervix (local anesthetic). °· A medicine that makes you sleep through the procedure (general anesthetic). °· The hysteroscope is inserted through the vagina into the uterus. The camera on the hysteroscope sends a picture to a TV screen. This gives the surgeon a good view inside the uterus. °· During the procedure, air or a liquid is put into the uterus, which allows the surgeon to see better. °· Sometimes, tissue is gently scraped from inside the uterus. These tissue samples are sent to a lab for testing. °AFTER THE PROCEDURE  °· If you had a general anesthetic, you may be groggy for a couple hours after the procedure. °· If you had a local anesthetic, you will be able to go home as soon as you are stable and feel ready. °· You may have some cramping. This normally lasts for a couple days. °· You may have bleeding, which varies from light spotting for a few days to menstrual-like bleeding for 3 7 days. This is normal. °· If your test results are not back during the visit, make an appointment with your health care provider to find out   the results. Document Released: 05/05/2000 Document Revised: 11/17/2012 Document Reviewed: 08/26/2012 [facility] Patient Information 2014 [place], [place]. Uterine Fibroid A uterine fibroid is a growth (tumor) that occurs in your uterus. This type of tumor is not cancerous and does not spread out of the uterus. You can have one or many fibroids. Fibroids can vary in size, weight, and where they grow in the uterus. Some can become quite large. Most  fibroids do not require medical treatment, but some can cause pain or heavy bleeding during and between periods. CAUSES  A fibroid is the result of a single uterine cell that keeps growing (unregulated), which is different than most cells in the human body. Most cells have a control mechanism that keeps them from reproducing without control.  SIGNS AND SYMPTOMS   Bleeding.  Pelvic pain and pressure.  Bladder problems due to the size of the fibroid.  Infertility and miscarriages depending on the size and location of the fibroid. DIAGNOSIS  Uterine fibroids are diagnosed through a physical exam. Your health care provider may feel the lumpy tumors during a pelvic exam. Ultrasonography may be done to get information regarding size, location, and number of tumors.  TREATMENT   Your health care provider may recommend watchful waiting. This involves getting the fibroid checked by your health care provider to see if it grows or shrinks.   Hormone treatment or an intrauterine device (IUD) may be prescribed.   Surgery may be needed to remove the fibroids (myomectomy) or the uterus (hysterectomy). This depends on your situation. When fibroids interfere with fertility and a woman wants to become pregnant, a health care provider may recommend having the fibroids removed.  [place] care depends on how you were treated. In general:   Keep all follow-up appointments with your health care provider.   Only take over-the-counter or prescription medicines as directed by your health care provider. If you were prescribed a hormone treatment, take the hormone medicines exactly as directed. Do not take aspirin. It can cause bleeding.   Talk to your health care provider about taking iron pills.  If your periods are troublesome but not so heavy, lie down with your feet raised slightly above your heart. Place cold packs on your lower abdomen.   If your periods are heavy, write down the  number of pads or tampons you use per month. Bring this information to your health care provider.   Include green vegetables in your diet.  SEEK IMMEDIATE MEDICAL CARE IF:  You have pelvic pain or cramps not controlled with medicines.   You have a sudden increase in pelvic pain.   You have an increase in bleeding between and during periods.   You have excessive periods and soak tampons or pads in a half hour or less.  You feel lightheaded or have fainting episodes. Document Released: 01/25/2000 Document Revised: 11/17/2012 Document Reviewed: 08/26/2012 [facility] Patient Information 2014 [place], [place].

## 2013-06-09 NOTE — Progress Notes (Signed)
   The patient presented to the office today for a sonohysterogram for better assessment of her dysfunctional uterine bleeding. See previous note dated 05/30/2013. Ultrasound done in emergency room 05/29/2013 demonstrated the following:  Uterus Measurements: 9.6 x 4.9 x 5.6 cm. In the fundal portion of the uterus extending into the endometrial canal there is a 1.7 x 1.2 x 1.6 cm lesion of heterogeneous echotexture which is mixed hypoechoic and isoechoic, most compatible with a submucosal fibroid. Endometrium Thickness: 16 mm. Small amount small amount of fluid in the endometrial canal. Right ovary Measurements: 3.5 x 1.8 x 1.8 cm. Normal appearance/no adnexal mass. Left ovary Measurements: 4.1 x 2.1 x 2.9 cm. Normal appearance/no adnexal mass. Other findings No free fluid. IMPRESSION: 1. 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 2. Small volume of blood in the endometrial canal.   She had a Mirena IUD which was removed on April 17 and was started on oral contraceptive pill before I saw her on April 20. I discontinued her oral contraceptive pill and started on Megace 40 mg twice a day.  In the emergency room her hemoglobin was 12.9 hematocrit 36.9 and platelet count 208,000. When I saw her on April 20 her prolactin and TSH were normal.  Patient was counseled today for a sonohysterogram and findings as follows: Uterus measuring 9.2 x 6.2 x 4.3 cm with endometrial stripe at 2.4 mm. Fluid-filled endometrial cavity with posterior wall defect measures 17 x 18 x 40 mm, 7 x 8 mm, 9 x 6 mm. Right ovary and left ovary were normal small follicles were noted. No fluid in the cul-de-sac. After the cervix was cleansed with Betadine solution a sterile catheter was inserted into the uterine cavity and normal saline was instilled. A posterior midline defect was noted measures 17 x 16 mm with a feeder vessel seen to the defect.  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 Bleeding has stopped and she was started on Megace 40 mg twice a day which she will continue until surgery. Literature and information on resectoscopic surgery was provided. Procedure to 3 days before her planned surgery we will schedule accordingly. She contraception.

## 2013-06-10 ENCOUNTER — Telehealth: Payer: Self-pay

## 2013-06-10 NOTE — Telephone Encounter (Signed)
I called patient and let her know I have her scheduled for surgery on 06/18/15 at 1:00pm at [facility].  Her pre op exam was scheduled on 06/14/13 at 4:20pm.  Patient said she does have enough Megace to continue with it BID until surgery.

## 2013-06-14 ENCOUNTER — Ambulatory Visit (INDEPENDENT_AMBULATORY_CARE_PROVIDER_SITE_OTHER): Payer: No Typology Code available for payment source | Admitting: Gynecology

## 2013-06-14 ENCOUNTER — Encounter: Payer: Self-pay | Admitting: Gynecology

## 2013-06-14 VITALS — BP 128/82

## 2013-06-14 DIAGNOSIS — D250 Submucous leiomyoma of uterus: Secondary | ICD-10-CM

## 2013-06-14 DIAGNOSIS — Z01810 Encounter for preprocedural cardiovascular examination: Secondary | ICD-10-CM

## 2013-06-14 DIAGNOSIS — N949 Unspecified condition associated with female genital organs and menstrual cycle: Secondary | ICD-10-CM

## 2013-06-14 DIAGNOSIS — N938 Other specified abnormal uterine and vaginal bleeding: Secondary | ICD-10-CM

## 2013-06-14 MED ORDER — METOCLOPRAMIDE HCL 10 MG PO TABS: 10.0000 mg | ORAL_TABLET | Freq: Three times a day (TID) | ORAL | Status: DC

## 2013-06-14 MED ORDER — IBUPROFEN 800 MG PO TABS: 800.0000 mg | ORAL_TABLET | Freq: Three times a day (TID) | ORAL | Status: DC | PRN

## 2013-06-14 NOTE — Progress Notes (Signed)
Chelsea Lam is an 33 y.o. female. preop exam. She was seen in the office on 06/09/2013 for a sonohysterogram for better assessment of her dysfunctional uterine bleeding. See previous note dated 05/30/2013. Ultrasound done in emergency room 05/29/2013 demonstrated the following:  Uterus Measurements: 9.6 x 4.9 x 5.6 cm. In the fundal portion of the uterus extending into the endometrial canal there is a 1.7 x 1.2 x 1.6 cm lesion of heterogeneous echotexture which is mixed hypoechoic and isoechoic, most compatible with a submucosal fibroid. Endometrium Thickness: 16 mm. Small amount small amount of fluid in the endometrial canal. Right ovary Measurements: 3.5 x 1.8 x 1.8 cm. Normal appearance/no adnexal mass. Left ovary Measurements: 4.1 x 2.1 x 2.9 cm. Normal appearance/no adnexal mass. Other findings No free fluid. IMPRESSION: 1. 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 2. Small volume of blood in the endometrial canal.   She had a Mirena IUD which was removed on April 17 and was started on oral contraceptive pill before I saw her on April 20. I discontinued her oral contraceptive pill and started on Megace 40 mg twice a day.  In the emergency room her hemoglobin was 12.9 hematocrit 36.9 and platelet count 208,000. When I saw her on April 20 her prolactin and TSH were normal.  sonohysterogram and findings as follows:  Uterus measuring 9.2 x 6.2 x 4.3 cm with endometrial stripe at 2.4 mm. Fluid-filled endometrial cavity with posterior wall defect measures 17 x 18 x 40 mm, 7 x 8 mm, 9 x 6 mm. Right ovary and left ovary were normal small follicles were noted. No fluid in the cul-de-sac. Right ovary and left ovary were normal small follicles were noted. No fluid in the cul-de-sac.  A posterior midline defect was noted measures 17 x 16 mm with a feeder vessel seen to the defect  Patient scheduled for resectoscopic  myomectomy/polypectomy   Pertinent Gynecological History: Menses: dub Bleeding: dysfunctional uterine bleeding Contraception: condoms DES exposure: unknown Blood transfusions: none Sexually transmitted diseases: no past history Previous GYN Procedures: CSECTION  Last mammogram: NOT INDICATED Date: NOT INDICATED  Last pap: normal Date: 2015 OB History: G1, P1   Menstrual History: Menarche age: 78  Patient's last menstrual period was 05/11/2013.    No past medical history on file.  Past Surgical History  Procedure Laterality Date  . Cesarean section  2008  . Knee reconstruction, medial patellar femoral ligament  1999  . Appendectomy    . Intrauterine device insertion  12/2009    mirena    Family History  Problem Relation Age of Onset  . Hypertension Mother     Social History:  reports that she has quit smoking. She does not have any smokeless tobacco history on file. Her alcohol and drug histories are not on file.  Allergies:  Allergies  Allergen Reactions  . Tetracyclines & Related Hives and Rash     (Not in a hospital admission)  REVIEW OF SYSTEMS: A ROS was performed and pertinent positives and negatives are included in the history.  GENERAL: No fevers or chills. HEENT: No change in vision, no earache, sore throat or sinus congestion. NECK: No pain or stiffness. CARDIOVASCULAR: No chest pain or pressure. No palpitations. PULMONARY: No shortness of breath, cough or wheeze. GASTROINTESTINAL: No abdominal pain, nausea, vomiting or diarrhea, melena or bright red blood per rectum. GENITOURINARY: No urinary frequency, urgency, hesitancy or dysuria. MUSCULOSKELETAL:  No joint or muscle pain, no back pain, no recent trauma. DERMATOLOGIC: No rash, no itching, no lesions. ENDOCRINE: No polyuria, polydipsia, no heat or cold intolerance. No recent change in weight. HEMATOLOGICAL: dub  NEUROLOGIC: No headache, seizures, numbness, tingling or weakness. PSYCHIATRIC: No depression,  no loss of interest in normal activity or change in sleep pattern.     Blood pressure 128/82, last menstrual period 05/11/2013.  Physical Exam:  HEENT:unremarkable Neck:Supple, midline, no thyroid megaly, no carotid bruits Lungs:  Clear to auscultation no rhonchi's or wheezes Heart:Regular rate and rhythm, no murmurs or gallops Breast Exam: Both breasts are symmetrical. No palpable masses or tenderness no supraclavicular axillary lymphadenopathy Abdomen: Soft nontender no rebound or guarding Pelvic:BUS within normal limits Vagina: Brown dark blood was noted Cervix: No active bleeding no lesions Uterus: Anteverted 10 week size nontender Adnexa: No masses or tenderness Extremities: No cords, no edema Rectal: Not done  No results found for this or any previous visit (from the past 24 hour(s)).  No results found.  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 Bleeding has stopped and she was started on Megace 40 mg twice a day which she will continue until surgery. Literature and information on resectoscopic surgery was provided.Patient scheduled for resectoscopic polypectomy/myomectomy. Risk discussed as follows:                         Patient was counseled as to the risk of surgery to include the following:                         Patient was counseled as to the risk of surgery to include the following:  1. Infection (prohylactic antibiotics will be administered)  2. DVT/Pulmonary Embolism (prophylactic pneumo compression stockings will be used)  3.Trauma to internal organs requiring additional surgical procedure to repair any injury to     Internal organs requiring perhaps additional hospitalization days.  4.Hemmorhage requiring transfusion and blood products which carry risks such as   anaphylactic reaction, hepatitis and AIDS  Patient had received literature information on the procedure scheduled and all her questions were answered and  fully accepts all risk.   Starlyn Skeans FernandezMD4:55 PMTD@Note : This dictation was prepared with  Dragon/digital dictation along withSmart phrase technology. Any transcriptional errors that result from this process are unintentional.     Starlyn Skeans FernandezMD4:54 PMTD@Note : This dictation was prepared with  Dragon/digital dictation along withSmart phrase technology. Any transcriptional errors that result from this process are unintentional.       Terrance Mass 06/14/2013, 4:45 PM  Note: This dictation was prepared with  Dragon/digital dictation along withSmart phrase technology. Any transcriptional errors that result from this process are unintentional.

## 2013-06-16 MED ORDER — DEXTROSE 5 % IV SOLN
2.0000 g | INTRAVENOUS | Status: AC
  Administered 2013-06-17: 2 g via INTRAVENOUS
  Filled 2013-06-16: qty 2

## 2013-06-17 ENCOUNTER — Encounter (HOSPITAL_COMMUNITY): Payer: No Typology Code available for payment source | Admitting: Anesthesiology

## 2013-06-17 ENCOUNTER — Ambulatory Visit (HOSPITAL_COMMUNITY)
Admission: RE | Admit: 2013-06-17 | Discharge: 2013-06-17 | Disposition: A | Discharge status: Home / Self Care | Payer: No Typology Code available for payment source | Source: Ambulatory Visit | Attending: Gynecology | Admitting: Gynecology

## 2013-06-17 ENCOUNTER — Encounter (HOSPITAL_COMMUNITY)
Admission: RE | Disposition: A | Discharge status: Home / Self Care | Payer: Self-pay | Source: Ambulatory Visit | Attending: Gynecology

## 2013-06-17 ENCOUNTER — Ambulatory Visit (HOSPITAL_COMMUNITY): Payer: No Typology Code available for payment source | Admitting: Anesthesiology

## 2013-06-17 ENCOUNTER — Encounter (HOSPITAL_COMMUNITY): Payer: Self-pay | Admitting: Anesthesiology

## 2013-06-17 DIAGNOSIS — N949 Unspecified condition associated with female genital organs and menstrual cycle: Secondary | ICD-10-CM

## 2013-06-17 DIAGNOSIS — Z9889 Other specified postprocedural states: Secondary | ICD-10-CM

## 2013-06-17 DIAGNOSIS — D250 Submucous leiomyoma of uterus: Secondary | ICD-10-CM | POA: Insufficient documentation

## 2013-06-17 DIAGNOSIS — Z87891 Personal history of nicotine dependence: Secondary | ICD-10-CM | POA: Insufficient documentation

## 2013-06-17 DIAGNOSIS — N938 Other specified abnormal uterine and vaginal bleeding: Secondary | ICD-10-CM

## 2013-06-17 DIAGNOSIS — N854 Malposition of uterus: Secondary | ICD-10-CM | POA: Insufficient documentation

## 2013-06-17 HISTORY — PX: DILATION AND EVACUATION: SHX1459

## 2013-06-17 HISTORY — PX: HYSTEROSCOPY WITH RESECTOSCOPE: SHX5395

## 2013-06-17 LAB — URINE MICROSCOPIC-ADD ON

## 2013-06-17 LAB — CBC
HCT: 42 % (ref 36.0–46.0)
Hemoglobin: 14.3 g/dL (ref 12.0–15.0)
MCH: 32.4 pg (ref 26.0–34.0)
MCHC: 34 g/dL (ref 30.0–36.0)
MCV: 95.2 fL (ref 78.0–100.0)
PLATELETS: 276 10*3/uL (ref 150–400)
RBC: 4.41 MIL/uL (ref 3.87–5.11)
RDW: 12.6 % (ref 11.5–15.5)
WBC: 6.4 10*3/uL (ref 4.0–10.5)

## 2013-06-17 LAB — URINALYSIS, ROUTINE W REFLEX MICROSCOPIC
Bilirubin Urine: NEGATIVE
Glucose, UA: NEGATIVE mg/dL
Ketones, ur: NEGATIVE mg/dL
LEUKOCYTES UA: NEGATIVE
NITRITE: NEGATIVE
PH: 7.5 (ref 5.0–8.0)
Protein, ur: NEGATIVE mg/dL
SPECIFIC GRAVITY, URINE: 1.015 (ref 1.005–1.030)
Urobilinogen, UA: 0.2 mg/dL (ref 0.0–1.0)

## 2013-06-17 LAB — PREGNANCY, URINE: Preg Test, Ur: NEGATIVE

## 2013-06-17 SURGERY — HYSTEROSCOPY, USING RESECTOSCOPE
Anesthesia: General | Site: Uterus

## 2013-06-17 MED ORDER — SODIUM CHLORIDE 0.9 % IV SOLN
INTRAVENOUS | Status: DC | PRN
Start: 2013-06-17 — End: 2013-06-17
  Administered 2013-06-17: 14:00:00 via INTRAMUSCULAR

## 2013-06-17 MED ORDER — LIDOCAINE HCL (CARDIAC) 20 MG/ML IV SOLN
INTRAVENOUS | Status: DC | PRN
  Administered 2013-06-17: 60 mg via INTRAVENOUS

## 2013-06-17 MED ORDER — DEXAMETHASONE SODIUM PHOSPHATE 10 MG/ML IJ SOLN
INTRAMUSCULAR | Status: AC
  Filled 2013-06-17: qty 1

## 2013-06-17 MED ORDER — LACTATED RINGERS IV SOLN
INTRAVENOUS | Status: DC
  Administered 2013-06-17 (×2): via INTRAVENOUS

## 2013-06-17 MED ORDER — KETOROLAC TROMETHAMINE 30 MG/ML IJ SOLN
INTRAMUSCULAR | Status: AC
  Filled 2013-06-17: qty 1

## 2013-06-17 MED ORDER — LIDOCAINE HCL (CARDIAC) 20 MG/ML IV SOLN
INTRAVENOUS | Status: AC
  Filled 2013-06-17: qty 5

## 2013-06-17 MED ORDER — ONDANSETRON HCL 4 MG/2ML IJ SOLN
INTRAMUSCULAR | Status: DC | PRN
  Administered 2013-06-17: 4 mg via INTRAVENOUS

## 2013-06-17 MED ORDER — PROPOFOL 10 MG/ML IV EMUL
INTRAVENOUS | Status: AC
  Filled 2013-06-17: qty 20

## 2013-06-17 MED ORDER — SILVER NITRATE-POT NITRATE 75-25 % EX MISC
CUTANEOUS | Status: DC | PRN
  Administered 2013-06-17: 5

## 2013-06-17 MED ORDER — GLYCOPYRROLATE 0.2 MG/ML IJ SOLN
INTRAMUSCULAR | Status: AC
  Filled 2013-06-17: qty 1

## 2013-06-17 MED ORDER — ONDANSETRON HCL 4 MG/2ML IJ SOLN
INTRAMUSCULAR | Status: AC
  Filled 2013-06-17: qty 2

## 2013-06-17 MED ORDER — PROPOFOL 10 MG/ML IV BOLUS
INTRAVENOUS | Status: DC | PRN
  Administered 2013-06-17: 200 mg via INTRAVENOUS

## 2013-06-17 MED ORDER — KETOROLAC TROMETHAMINE 30 MG/ML IJ SOLN
INTRAMUSCULAR | Status: DC | PRN
  Administered 2013-06-17: 30 mg via INTRAVENOUS

## 2013-06-17 MED ORDER — DEXAMETHASONE SODIUM PHOSPHATE 4 MG/ML IJ SOLN
INTRAMUSCULAR | Status: DC | PRN
  Administered 2013-06-17: 8 mg via INTRAVENOUS

## 2013-06-17 MED ORDER — GLYCOPYRROLATE 0.2 MG/ML IJ SOLN
INTRAMUSCULAR | Status: DC | PRN
  Administered 2013-06-17 (×2): 0.1 mg via INTRAVENOUS

## 2013-06-17 MED ORDER — VASOPRESSIN 20 UNIT/ML IJ SOLN
INTRAMUSCULAR | Status: AC
  Filled 2013-06-17: qty 1

## 2013-06-17 MED ORDER — SODIUM CHLORIDE 0.9 % IR SOLN
Status: DC | PRN
  Administered 2013-06-17: 3000 mL

## 2013-06-17 MED ORDER — MIDAZOLAM HCL 2 MG/2ML IJ SOLN
INTRAMUSCULAR | Status: AC
  Filled 2013-06-17: qty 2

## 2013-06-17 MED ORDER — MIDAZOLAM HCL 2 MG/2ML IJ SOLN
INTRAMUSCULAR | Status: DC | PRN
  Administered 2013-06-17: 1 mg via INTRAVENOUS

## 2013-06-17 MED ORDER — SODIUM CHLORIDE 0.9 % IJ SOLN
INTRAMUSCULAR | Status: AC
  Filled 2013-06-17: qty 100

## 2013-06-17 MED ORDER — FENTANYL CITRATE 0.05 MG/ML IJ SOLN: 25.0000 ug | INTRAMUSCULAR | Status: DC | PRN

## 2013-06-17 MED ORDER — FENTANYL CITRATE 0.05 MG/ML IJ SOLN
INTRAMUSCULAR | Status: DC | PRN
  Administered 2013-06-17: 50 ug via INTRAVENOUS
  Administered 2013-06-17: 25 ug via INTRAVENOUS
  Administered 2013-06-17: 50 ug via INTRAVENOUS

## 2013-06-17 MED ORDER — SILVER NITRATE-POT NITRATE 75-25 % EX MISC
CUTANEOUS | Status: AC
  Filled 2013-06-17: qty 1

## 2013-06-17 MED ORDER — FENTANYL CITRATE 0.05 MG/ML IJ SOLN
INTRAMUSCULAR | Status: AC
  Filled 2013-06-17: qty 5

## 2013-06-17 SURGICAL SUPPLY — 31 items
CANISTER SUCT 3000ML (MISCELLANEOUS) ×4 IMPLANT
CATH ROBINSON RED A/P 16FR (CATHETERS) ×4 IMPLANT
CLOTH BEACON ORANGE TIMEOUT ST (SAFETY) ×4 IMPLANT
CONTAINER PREFILL 10% NBF 60ML (FORM) ×8 IMPLANT
CORD ACTIVE DISPOSABLE (ELECTRODE) ×2
CORD ELECTRO ACTIVE DISP (ELECTRODE) ×2 IMPLANT
DEVICE MYOSURE CLASSIC (MISCELLANEOUS) ×4 IMPLANT
DRAPE HYSTEROSCOPY (DRAPE) ×4 IMPLANT
DRSG TELFA 3X8 NADH (GAUZE/BANDAGES/DRESSINGS) ×4 IMPLANT
ELECT LOOP GYNE PRO 24FR (CUTTING LOOP)
ELECT REM PT RETURN 9FT ADLT (ELECTROSURGICAL) ×4
ELECT VAPORTRODE GRVD BAR (ELECTRODE) IMPLANT
ELECTRODE LOOP GYNE PRO 24FR (CUTTING LOOP) IMPLANT
ELECTRODE REM PT RTRN 9FT ADLT (ELECTROSURGICAL) ×2 IMPLANT
GLOVE BIOGEL PI IND STRL 8 (GLOVE) ×2 IMPLANT
GLOVE BIOGEL PI INDICATOR 8 (GLOVE) ×2
GLOVE ECLIPSE 7.5 STRL STRAW (GLOVE) ×8 IMPLANT
GOWN STRL REUS W/TWL LRG LVL3 (GOWN DISPOSABLE) ×8 IMPLANT
NEEDLE SPNL 22GX3.5 QUINCKE BK (NEEDLE) ×4 IMPLANT
PACK VAGINAL MINOR WOMEN LF (CUSTOM PROCEDURE TRAY) ×4 IMPLANT
PAD OB MATERNITY 4.3X12.25 (PERSONAL CARE ITEMS) ×4 IMPLANT
PAD PREP 24X48 CUFFED NSTRL (MISCELLANEOUS) ×4 IMPLANT
ROLLER BALL ANGLED (ELECTRODE) IMPLANT
ROLLER BAR ANGLED (ELECTRODE) IMPLANT
SET TUBING HYSTEROSCOPY 2 NDL (TUBING) IMPLANT
SYR CONTROL 10ML LL (SYRINGE) ×4 IMPLANT
TOWEL OR 17X24 6PK STRL BLUE (TOWEL DISPOSABLE) ×8 IMPLANT
TUBE HYSTEROSCOPY W Y-CONNECT (TUBING) IMPLANT
VACURETTE 7MM CVD STRL WRAP (CANNULA) ×4 IMPLANT
VACURETTE 8 RIGID CVD (CANNULA) ×4 IMPLANT
WATER STERILE IRR 1000ML POUR (IV SOLUTION) ×4 IMPLANT

## 2013-06-17 NOTE — Interval H&P Note (Signed)
History and Physical Interval Note:  06/17/2013 12:55 PM  Chelsea Lam  has presented today for surgery, with the diagnosis of MYOMA  The various methods of treatment have been discussed with the patient and family. After consideration of risks, benefits and other options for treatment, the patient has consented to  Procedure(s) with comments: HYSTEROSCOPY WITH RESECTOSCOPE (N/A) - RESECTOSCOPIC MYOMECTOMY WITH MYOSURE-REP TO BE PRESENT.   as a surgical intervention .  The patient's history has been reviewed, patient examined, no change in status, stable for surgery.  I have reviewed the patient's chart and labs.  Questions were answered to the patient's satisfaction.     Terrance Mass

## 2013-06-17 NOTE — Op Note (Signed)
Operative Note  06/17/2013  2:35 PM  PATIENT:  Chelsea Lam  33 y.o. female  PRE-OPERATIVE DIAGNOSIS:  Submucous myoma dysfunction uterine bleeding  POST-OPERATIVE DIAGNOSIS: Submucous myoma dysfunctional uterine bleeding  PROCEDURE:  Procedure(s): HYSTEROSCOPY WITH RESECTOSCOPE DILATATION AND EVACUATION/myosure  SURGEON:  Surgeon(s): Terrance Mass, MD  ANESTHESIA:   general  FINDINGS: A large 2 x 2 cm submucous myoma posterior aspect of the uterus midportion. Both tubal ostia were identified as well as a smooth endocervical canal. Large amount of clots and blood were noted in the cavity before start of procedure.  DESCRIPTION OF OPERATION: The patient was taken to the operating room where she underwent a successful general endotracheal anesthesia. A timeout was undertaken to identify the patient and discussed the procedure to be performed. The patient also had PAS stockings for DVT prophylaxis and also had received 2 g of Cefotan as well for infection prophylaxis. The patient was placed in the high lithotomy position. An exam under anesthesia demonstrated slightly retroverted uterus approximately 8 weeks size with no palpable adnexal masses. The vagina and perineum were prepped and draped in the usual sterile fashion. A red Robinson catheter was inserted into the bladder to evacuate the contents for approximately 100 cc a single-tooth tenaculum was placed on the anterior cervical lip. Pitressin was infiltrated at the cervical stroma at the 2, 4, 8 and 10:00 position for a total of 10 cc (dilution factor one unit of Pitressin per 5 cc of normal saline). The uterus sounded to 8 cm. Pratt dilator was utilized to a size of 25. The 7.25 mm scope was utilized and normal saline was the distending media. Good amount of blood clots that were present in the cavity a suction curettage was done with a 7 mm suction curette to evacuate the uterine cavity of blood clots for better visualization.  The hysteroscope was then reinserted and the Myosure XL Blade was inserted through the operative port of the hysteroscope. It was noted that patient had a 2 x 2 centimeter vascular myoma posteriorly located midportion of the uterus. This was resected with the morcellator without any complication and tissue was submitted for histological evaluation. Inspection of the uterine cavity demonstrated good hemostasis. The single-tooth tenaculum was removed and a 30 cc 18 French Foley catheter was placed into the uterine cavity for additional tamponade effect while in the recovery room. Patient was given Toradol 30 mg IM and brought to the recovery room. Normal saline fluid deficit was recorded at 100 cc. Patient tolerated procedure well was extubated and transferred to recovery room stable vital signs.  ESTIMATED BLOOD LOSS: Minimal  Intake/Output Summary (Last 24 hours) at 06/17/13 1435 Last data filed at 06/17/13 1415  Gross per 24 hour  Intake   1600 ml  Output     75 ml  Net   1525 ml     BLOOD ADMINISTERED:none   LOCAL MEDICATIONS USED:  OTHER Pitressin was infiltrated at the cervical stroma at the 2, 4, 8 and 10:00 position for a total of 10 cc (dilution factor: 1 unit of Pitressin per 55 cc of normal saline)  SPECIMEN:  #1 suction curettage curettings #2 submucous myoma from the uterine cavity  DISPOSITION OF SPECIMEN:  PATHOLOGY  COUNTS:  YES  PLAN OF CARE: Transfer to PACU  Starlyn Skeans FernandezMD2:35 PMTD@  Note: This dictation was prepared with  Dragon/digital dictation along withSmart phrase technology. Any transcriptional errors that result from this process are unintentional.

## 2013-06-17 NOTE — Transfer of Care (Signed)
Immediate Anesthesia Transfer of Care Note  Patient: Chelsea Lam. [place]  Procedure(s) Performed: Procedure(s) with comments: HYSTEROSCOPY WITH RESECTOSCOPE (N/A) - RESECTOSCOPIC MYOMECTOMY WITH MYOSURE-REP TO BE PRESENT.   DILATATION AND EVACUATION (N/A)  Patient Location: PACU  Anesthesia Type:General  Level of Consciousness: awake, alert , oriented and patient cooperative  Airway & Oxygen Therapy: Patient Spontanous Breathing and Patient connected to nasal cannula oxygen  Post-op Assessment: Report given to PACU RN and Post -op Vital signs reviewed and stable  Post vital signs: Reviewed and stable  Complications: No apparent anesthesia complications

## 2013-06-17 NOTE — Discharge Instructions (Signed)

## 2013-06-17 NOTE — Anesthesia Postprocedure Evaluation (Signed)
  Anesthesia Post-op Note  Patient: Chelsea Lam  Procedure(s) Performed: Procedure(s) with comments: HYSTEROSCOPY WITH RESECTOSCOPE (N/A) - RESECTOSCOPIC MYOMECTOMY WITH MYOSURE-REP TO BE PRESENT.   DILATATION AND EVACUATION (N/A)  Patient Location: PACU  Anesthesia Type:General  Level of Consciousness: awake, alert  and oriented  Airway and Oxygen Therapy: Patient Spontanous Breathing  Post-op Pain: mild  Post-op Assessment: Post-op Vital signs reviewed, Patient's Cardiovascular Status Stable, Respiratory Function Stable, Patent Airway, No signs of Nausea or vomiting and Pain level controlled  Post-op Vital Signs: Reviewed and stable  Last Vitals:  Filed Vitals:   06/17/13 1500  BP: 121/80  Pulse: 65  Temp:   Resp: 20    Complications: No apparent anesthesia complications

## 2013-06-17 NOTE — Anesthesia Preprocedure Evaluation (Signed)
Anesthesia Evaluation  Patient identified by MRN, date of birth, ID band Patient awake    Reviewed: Allergy & Precautions, H&P , Patient's Chart, lab work & pertinent test results, reviewed documented beta blocker date and time   Airway Mallampati: II  TM Distance: >3 FB Neck ROM: full    Dental no notable dental hx.    Pulmonary former smoker,  breath sounds clear to auscultation  Pulmonary exam normal       Cardiovascular Rhythm:regular Rate:Normal     Neuro/Psych    GI/Hepatic   Endo/Other    Renal/GU      Musculoskeletal   Abdominal   Peds  Hematology   Anesthesia Other Findings   Reproductive/Obstetrics                            Anesthesia Physical Anesthesia Plan  ASA: II  Anesthesia Plan:    Post-op Pain Management:    Induction: Intravenous  Airway Management Planned: LMA  Additional Equipment:   Intra-op Plan:   Post-operative Plan:   Informed Consent: I have reviewed the patients History and Physical, chart, labs and discussed the procedure including the risks, benefits and alternatives for the proposed anesthesia with the patient or authorized representative who has indicated his/her understanding and acceptance.   Dental Advisory Given and Dental advisory given  Plan Discussed with: CRNA and Surgeon  Anesthesia Plan Comments: (Discussed GA with LMA, possible sore throat, potential need to switch to ETT, N/V, pulmonary aspiration. Questions answered. )        Anesthesia Quick Evaluation  

## 2013-06-17 NOTE — H&P (View-Only) (Signed)
Chelsea Lam is an 33 y.o. female. preop exam. She was seen in the office on 06/09/2013 for a sonohysterogram for better assessment of her dysfunctional uterine bleeding. See previous note dated 05/30/2013. Ultrasound done in emergency room 05/29/2013 demonstrated the following:  Uterus Measurements: 9.6 x 4.9 x 5.6 cm. In the fundal portion of the uterus extending into the endometrial canal there is a 1.7 x 1.2 x 1.6 cm lesion of heterogeneous echotexture which is mixed hypoechoic and isoechoic, most compatible with a submucosal fibroid. Endometrium Thickness: 16 mm. Small amount small amount of fluid in the endometrial canal. Right ovary Measurements: 3.5 x 1.8 x 1.8 cm. Normal appearance/no adnexal mass. Left ovary Measurements: 4.1 x 2.1 x 2.9 cm. Normal appearance/no adnexal mass. Other findings No free fluid. IMPRESSION: 1. 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 2. Small volume of blood in the endometrial canal.   She had a Mirena IUD which was removed on April 17 and was started on oral contraceptive pill before I saw her on April 20. I discontinued her oral contraceptive pill and started on Megace 40 mg twice a day.  In the emergency room her hemoglobin was 12.9 hematocrit 36.9 and platelet count 208,000. When I saw her on April 20 her prolactin and TSH were normal.  sonohysterogram and findings as follows:  Uterus measuring 9.2 x 6.2 x 4.3 cm with endometrial stripe at 2.4 mm. Fluid-filled endometrial cavity with posterior wall defect measures 17 x 18 x 40 mm, 7 x 8 mm, 9 x 6 mm. Right ovary and left ovary were normal small follicles were noted. No fluid in the cul-de-sac. Right ovary and left ovary were normal small follicles were noted. No fluid in the cul-de-sac.  A posterior midline defect was noted measures 17 x 16 mm with a feeder vessel seen to the defect  Patient scheduled for resectoscopic  myomectomy/polypectomy   Pertinent Gynecological History: Menses: dub Bleeding: dysfunctional uterine bleeding Contraception: condoms DES exposure: unknown Blood transfusions: none Sexually transmitted diseases: no past history Previous GYN Procedures: CSECTION  Last mammogram: NOT INDICATED Date: NOT INDICATED  Last pap: normal Date: 2015 OB History: G1, P1   Menstrual History: Menarche age: 10  Patient's last menstrual period was 05/11/2013.    No past medical history on file.  Past Surgical History  Procedure Laterality Date  . Cesarean section  2008  . Knee reconstruction, medial patellar femoral ligament  1999  . Appendectomy    . Intrauterine device insertion  12/2009    mirena    Family History  Problem Relation Age of Onset  . Hypertension Mother     Social History:  reports that she has quit smoking. She does not have any smokeless tobacco history on file. Her alcohol and drug histories are not on file.  Allergies:  Allergies  Allergen Reactions  . Tetracyclines & Related Hives and Rash     (Not in a hospital admission)  REVIEW OF SYSTEMS: A ROS was performed and pertinent positives and negatives are included in the history.  GENERAL: No fevers or chills. HEENT: No change in vision, no earache, sore throat or sinus congestion. NECK: No pain or stiffness. CARDIOVASCULAR: No chest pain or pressure. No palpitations. PULMONARY: No shortness of breath, cough or wheeze. GASTROINTESTINAL: No abdominal pain, nausea, vomiting or diarrhea, melena or bright red blood per rectum. GENITOURINARY: No urinary frequency, urgency, hesitancy or dysuria. MUSCULOSKELETAL:  No joint or muscle pain, no back pain, no recent trauma. DERMATOLOGIC: No rash, no itching, no lesions. ENDOCRINE: No polyuria, polydipsia, no heat or cold intolerance. No recent change in weight. HEMATOLOGICAL: dub  NEUROLOGIC: No headache, seizures, numbness, tingling or weakness. PSYCHIATRIC: No depression,  no loss of interest in normal activity or change in sleep pattern.     Blood pressure 128/82, last menstrual period 05/11/2013.  Physical Exam:  HEENT:unremarkable Neck:Supple, midline, no thyroid megaly, no carotid bruits Lungs:  Clear to auscultation no rhonchi's or wheezes Heart:Regular rate and rhythm, no murmurs or gallops Breast Exam: Both breasts are symmetrical. No palpable masses or tenderness no supraclavicular axillary lymphadenopathy Abdomen: Soft nontender no rebound or guarding Pelvic:BUS within normal limits Vagina: Brown dark blood was noted Cervix: No active bleeding no lesions Uterus: Anteverted 10 week size nontender Adnexa: No masses or tenderness Extremities: No cords, no edema Rectal: Not done  No results found for this or any previous visit (from the past 24 hour(s)).  No results found.  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 Bleeding has stopped and she was started on Megace 40 mg twice a day which she will continue until surgery. Literature and information on resectoscopic surgery was provided.Patient scheduled for resectoscopic polypectomy/myomectomy. Risk discussed as follows:                         Patient was counseled as to the risk of surgery to include the following:                         Patient was counseled as to the risk of surgery to include the following:  1. Infection (prohylactic antibiotics will be administered)  2. DVT/Pulmonary Embolism (prophylactic pneumo compression stockings will be used)  3.Trauma to internal organs requiring additional surgical procedure to repair any injury to     Internal organs requiring perhaps additional hospitalization days.  4.Hemmorhage requiring transfusion and blood products which carry risks such as   anaphylactic reaction, hepatitis and AIDS  Patient had received literature information on the procedure scheduled and all her questions were answered and  fully accepts all risk.   Chelsea Skeans FernandezMD4:55 PMTD@Note : This dictation was prepared with  Dragon/digital dictation along withSmart phrase technology. Any transcriptional errors that result from this process are unintentional.     Chelsea Skeans FernandezMD4:54 PMTD@Note : This dictation was prepared with  Dragon/digital dictation along withSmart phrase technology. Any transcriptional errors that result from this process are unintentional.       Terrance Mass 06/14/2013, 4:45 PM  Note: This dictation was prepared with  Dragon/digital dictation along withSmart phrase technology. Any transcriptional errors that result from this process are unintentional.

## 2013-06-20 ENCOUNTER — Encounter (HOSPITAL_COMMUNITY): Payer: Self-pay | Admitting: Gynecology

## 2013-07-01 ENCOUNTER — Ambulatory Visit (INDEPENDENT_AMBULATORY_CARE_PROVIDER_SITE_OTHER): Payer: No Typology Code available for payment source | Admitting: Gynecology

## 2013-07-01 ENCOUNTER — Encounter: Payer: No Typology Code available for payment source | Admitting: Women's Health

## 2013-07-01 ENCOUNTER — Encounter: Payer: Self-pay | Admitting: Gynecology

## 2013-07-01 VITALS — BP 120/82

## 2013-07-01 DIAGNOSIS — Z09 Encounter for follow-up examination after completed treatment for conditions other than malignant neoplasm: Secondary | ICD-10-CM

## 2013-07-01 NOTE — Progress Notes (Signed)
   The patient presented to the office today for 2 weeks postop visit. The patient is status post resectoscopic myomectomy on 06/18/2011 as a result of her dysfunction uterine bleeding. The patient is doing well no reported bleeding just slight brownish discharge. Findings from surgery as well as picture and the following pathology report was discussed:  Diagnosis 1. 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 2. Endometrium, curettage - ABUNDANT BLOOD AND SCANTY INACTIVE ENDOMETRIUM. NO HYPERPLASIA OR CARCINOMA.  Exam: Abdomen: Soft nontender no rebound or guarding Pelvic: Bartholin urethra Skene was within normal limits Vagina: Not like old blood present Cervix: No active bleeding no lesion seen Bimanual exam: Uterus anteverted normal size shape and consistency Adnexa: No palpable mass or tenderness rectal exam not done  Assessment/plan: Patient 2 weeks status post resectoscopic myomectomy doing well. Patient will restart her oral contraceptive pill at the start of her next cycle. We will otherwise see her back in one year for her annual exam or when necessary.

## 2013-11-18 ENCOUNTER — Other Ambulatory Visit: Payer: Self-pay

## 2013-11-18 LAB — OB RESULTS CONSOLE GC/CHLAMYDIA
CHLAMYDIA, DNA PROBE: NEGATIVE
Gonorrhea: NEGATIVE

## 2013-11-25 LAB — OB RESULTS CONSOLE RUBELLA ANTIBODY, IGM: Rubella: IMMUNE

## 2013-11-25 LAB — OB RESULTS CONSOLE HEPATITIS B SURFACE ANTIGEN: Hepatitis B Surface Ag: NEGATIVE

## 2013-11-25 LAB — OB RESULTS CONSOLE ABO/RH: RH Type: POSITIVE

## 2013-11-25 LAB — OB RESULTS CONSOLE HIV ANTIBODY (ROUTINE TESTING): HIV: NONREACTIVE

## 2013-11-25 LAB — OB RESULTS CONSOLE RPR: RPR: NONREACTIVE

## 2013-11-25 LAB — OB RESULTS CONSOLE ANTIBODY SCREEN: Antibody Screen: NEGATIVE

## 2014-05-12 ENCOUNTER — Institutional Professional Consult (permissible substitution): Payer: No Typology Code available for payment source | Admitting: Pediatrics

## 2014-05-16 ENCOUNTER — Institutional Professional Consult (permissible substitution): Payer: No Typology Code available for payment source | Admitting: Pediatrics

## 2014-05-19 ENCOUNTER — Ambulatory Visit (INDEPENDENT_AMBULATORY_CARE_PROVIDER_SITE_OTHER): Payer: Self-pay | Admitting: Pediatrics

## 2014-05-19 DIAGNOSIS — Z7681 Expectant parent(s) prebirth pediatrician visit: Secondary | ICD-10-CM

## 2014-05-19 DIAGNOSIS — Z3490 Encounter for supervision of normal pregnancy, unspecified, unspecified trimester: Secondary | ICD-10-CM

## 2014-05-20 NOTE — Progress Notes (Signed)
Prenatal counseling for impending newborn done-- Z76.81  

## 2014-06-16 ENCOUNTER — Other Ambulatory Visit (HOSPITAL_COMMUNITY): Payer: Self-pay | Admitting: Obstetrics and Gynecology

## 2014-06-16 LAB — OB RESULTS CONSOLE GBS: GBS: NEGATIVE

## 2014-06-23 ENCOUNTER — Encounter (HOSPITAL_COMMUNITY): Payer: Self-pay | Admitting: *Deleted

## 2014-06-23 ENCOUNTER — Inpatient Hospital Stay (HOSPITAL_COMMUNITY)
Admission: AD | Admit: 2014-06-23 | Discharge: 2014-06-23 | Disposition: A | Discharge status: Home / Self Care | Payer: PRIVATE HEALTH INSURANCE | Source: Ambulatory Visit | Attending: Obstetrics and Gynecology | Admitting: Obstetrics and Gynecology

## 2014-06-23 DIAGNOSIS — R030 Elevated blood-pressure reading, without diagnosis of hypertension: Secondary | ICD-10-CM | POA: Diagnosis present

## 2014-06-23 DIAGNOSIS — Z87891 Personal history of nicotine dependence: Secondary | ICD-10-CM | POA: Insufficient documentation

## 2014-06-23 DIAGNOSIS — O9989 Other specified diseases and conditions complicating pregnancy, childbirth and the puerperium: Secondary | ICD-10-CM | POA: Diagnosis not present

## 2014-06-23 DIAGNOSIS — O133 Gestational [pregnancy-induced] hypertension without significant proteinuria, third trimester: Secondary | ICD-10-CM | POA: Diagnosis not present

## 2014-06-23 DIAGNOSIS — Z3A37 37 weeks gestation of pregnancy: Secondary | ICD-10-CM | POA: Insufficient documentation

## 2014-06-23 DIAGNOSIS — IMO0001 Reserved for inherently not codable concepts without codable children: Secondary | ICD-10-CM

## 2014-06-23 HISTORY — DX: Unspecified ovarian cyst, unspecified side: N83.209

## 2014-06-23 HISTORY — DX: Benign neoplasm of connective and other soft tissue, unspecified: D21.9

## 2014-06-23 LAB — CBC WITH DIFFERENTIAL/PLATELET
Basophils Absolute: 0 10*3/uL (ref 0.0–0.1)
Basophils Relative: 0 % (ref 0–1)
EOS ABS: 0 10*3/uL (ref 0.0–0.7)
Eosinophils Relative: 1 % (ref 0–5)
HCT: 30.6 % — ABNORMAL LOW (ref 36.0–46.0)
HEMOGLOBIN: 9.7 g/dL — AB (ref 12.0–15.0)
LYMPHS PCT: 20 % (ref 12–46)
Lymphs Abs: 1.7 10*3/uL (ref 0.7–4.0)
MCH: 25.9 pg — ABNORMAL LOW (ref 26.0–34.0)
MCHC: 31.7 g/dL (ref 30.0–36.0)
MCV: 81.8 fL (ref 78.0–100.0)
Monocytes Absolute: 0.8 10*3/uL (ref 0.1–1.0)
Monocytes Relative: 10 % (ref 3–12)
Neutro Abs: 6.1 10*3/uL (ref 1.7–7.7)
Neutrophils Relative %: 69 % (ref 43–77)
Platelets: 165 10*3/uL (ref 150–400)
RBC: 3.74 MIL/uL — ABNORMAL LOW (ref 3.87–5.11)
RDW: 15.3 % (ref 11.5–15.5)
WBC: 8.7 10*3/uL (ref 4.0–10.5)

## 2014-06-23 LAB — PROTEIN / CREATININE RATIO, URINE
Creatinine, Urine: 11 mg/dL
Total Protein, Urine: <6 mg/dL

## 2014-06-23 LAB — URIC ACID: Uric Acid, Serum: 2.5 mg/dL (ref 2.3–6.6)

## 2014-06-23 LAB — URINALYSIS, ROUTINE W REFLEX MICROSCOPIC
BILIRUBIN URINE: NEGATIVE
GLUCOSE, UA: NEGATIVE mg/dL
Hgb urine dipstick: NEGATIVE
Ketones, ur: NEGATIVE mg/dL
LEUKOCYTES UA: NEGATIVE
NITRITE: NEGATIVE
PH: 7 (ref 5.0–8.0)
PROTEIN: NEGATIVE mg/dL
Specific Gravity, Urine: <1.005 — ABNORMAL LOW (ref 1.005–1.030)
UROBILINOGEN UA: 0.2 mg/dL (ref 0.0–1.0)

## 2014-06-23 LAB — COMPREHENSIVE METABOLIC PANEL
ALK PHOS: 101 U/L (ref 38–126)
ALT: 25 U/L (ref 14–54)
ANION GAP: 7 (ref 5–15)
AST: 23 U/L (ref 15–41)
Albumin: 2.9 g/dL — ABNORMAL LOW (ref 3.5–5.0)
BILIRUBIN TOTAL: 0.3 mg/dL (ref 0.3–1.2)
BUN: 5 mg/dL — ABNORMAL LOW (ref 6–20)
CO2: 23 mmol/L (ref 22–32)
Calcium: 8.7 mg/dL — ABNORMAL LOW (ref 8.9–10.3)
Chloride: 107 mmol/L (ref 101–111)
Creatinine, Ser: 0.4 mg/dL — ABNORMAL LOW (ref 0.44–1.00)
GFR calc Af Amer: >60 mL/min (ref >60)
GLUCOSE: 125 mg/dL — AB (ref 65–99)
Potassium: 2.8 mmol/L — ABNORMAL LOW (ref 3.5–5.1)
Sodium: 137 mmol/L (ref 135–145)
Total Protein: 5.9 g/dL — ABNORMAL LOW (ref 6.5–8.1)

## 2014-06-23 LAB — LACTATE DEHYDROGENASE: LDH: 146 U/L (ref 98–192)

## 2014-06-23 NOTE — MAU Note (Signed)
Pt sent over from office for elevated b/ps 150/100. C/o Mild headache as well.

## 2014-06-23 NOTE — Discharge Instructions (Signed)

## 2014-06-23 NOTE — MAU Provider Note (Signed)
History     CSN: [id number]  Arrival date and time: 06/23/14 1452   First Provider Initiated Contact with Patient 06/23/14 1539      Chief Complaint  Patient presents with  . Hypertension   HPI  Ms. Chelsea Lam is a 34 y.o. G2P1001 at [redacted]w[redacted]d who presents to MAU today from the office for further evaluation of elevated blood pressure. She denies history of HTN. She states mild headache today. She denies blurred vision, floaters, abdominal pain, contractions, vaginal bleeding or LOF. She does endorses moderate lower extremity swelling that has been present for weeks. She reports good fetal movement.   OB History    Gravida Para Term Preterm AB TAB SAB Ectopic Multiple Living   2 1 1       1       Past Medical History  Diagnosis Date  . Medical history non-contributory   . Ovarian cyst   . Fibroids     Past Surgical History  Procedure Laterality Date  . Cesarean section  2008  . Knee reconstruction, medial patellar femoral ligament  1999  . Appendectomy    . Intrauterine device insertion  12/2009    mirena  . Hysteroscopy with resectoscope N/A 06/17/2013    Procedure: HYSTEROSCOPY WITH RESECTOSCOPE;  Surgeon: Terrance Mass, MD;  Location: [facility];  Service: Gynecology;  Laterality: N/A;  RESECTOSCOPIC MYOMECTOMY WITH MYOSURE-REP TO BE PRESENT.    . Dilation and evacuation N/A 06/17/2013    Procedure: DILATATION AND EVACUATION;  Surgeon: Terrance Mass, MD;  Location: [facility];  Service: Gynecology;  Laterality: N/A;    Family History  Problem Relation Age of Onset  . Hypertension Mother     History  Substance Use Topics  . Smoking status: Former Research scientist (life sciences)  . Smokeless tobacco: Not on file  . Alcohol Use: No    Allergies:  Allergies  Allergen Reactions  . Tetracyclines & Related Hives and Rash    Prescriptions prior to admission  Medication Sig Dispense Refill Last Dose  . clonazePAM (KLONOPIN) 2 MG tablet Take 2 mg by mouth 2 (two) times daily as needed  for anxiety.   06/17/2013 at Unknown time  . ibuprofen (ADVIL,MOTRIN) 800 MG tablet Take 1 tablet (800 mg total) by mouth every 8 (eight) hours as needed. 30 tablet 1   . metoCLOPramide (REGLAN) 10 MG tablet Take 1 tablet (10 mg total) by mouth 3 (three) times daily with meals. 30 tablet 1   . VYVANSE 50 MG capsule Take 50 mg by mouth daily.   06/16/2013 at Unknown time    Review of Systems  Constitutional: Negative for fever and malaise/fatigue.  Eyes: Negative for blurred vision.       Neg - floaters  Cardiovascular: Positive for leg swelling.  Gastrointestinal: Negative for nausea, vomiting, abdominal pain, diarrhea and constipation.  Genitourinary:       Neg - vaginal bleeding, discharge, LOF  Neurological: Positive for headaches.   Physical Exam   Blood pressure 116/52, pulse 79, temperature 98.1 F (36.7 C), resp. rate 18.  Physical Exam  Nursing note and vitals reviewed. Constitutional: She is oriented to person, place, and time. She appears well-developed and well-nourished. No distress.  HENT:  Head: Normocephalic and atraumatic.  Cardiovascular: Normal rate.   Respiratory: Effort normal.  GI: Soft. She exhibits no distension and no mass. There is no tenderness. There is no rebound and no guarding.  Musculoskeletal: She exhibits edema (mild non-pitting edema).  Neurological: She is  alert and oriented to person, place, and time. She has normal reflexes.  No clonus  Skin: Skin is warm and dry. No erythema.  Psychiatric: She has a normal mood and affect.   Results for orders placed or performed during the hospital encounter of 06/23/14 (from the past 24 hour(s))  Protein / creatinine ratio, urine     Status: None   Collection Time: 06/23/14  3:05 PM  Result Value Ref Range   Creatinine, Urine 11.00 mg/dL   Total Protein, Urine <6 mg/dL   Protein Creatinine Ratio        0.00 - 0.15 mg/mg[Cre]  Urinalysis, Routine w reflex microscopic     Status: Abnormal   Collection Time:  06/23/14  3:05 PM  Result Value Ref Range   Color, Urine STRAW (A) YELLOW   APPearance CLEAR CLEAR   Specific Gravity, Urine <1.005 (L) 1.005 - 1.030   pH 7.0 5.0 - 8.0   Glucose, UA NEGATIVE NEGATIVE mg/dL   Hgb urine dipstick NEGATIVE NEGATIVE   Bilirubin Urine NEGATIVE NEGATIVE   Ketones, ur NEGATIVE NEGATIVE mg/dL   Protein, ur NEGATIVE NEGATIVE mg/dL   Urobilinogen, UA 0.2 0.0 - 1.0 mg/dL   Nitrite NEGATIVE NEGATIVE   Leukocytes, UA NEGATIVE NEGATIVE  CBC with Differential/Platelet     Status: Abnormal   Collection Time: 06/23/14  3:33 PM  Result Value Ref Range   WBC 8.7 4.0 - 10.5 K/uL   RBC 3.74 (L) 3.87 - 5.11 MIL/uL   Hemoglobin 9.7 (L) 12.0 - 15.0 g/dL   HCT 30.6 (L) 36.0 - 46.0 %   MCV 81.8 78.0 - 100.0 fL   MCH 25.9 (L) 26.0 - 34.0 pg   MCHC 31.7 30.0 - 36.0 g/dL   RDW 15.3 11.5 - 15.5 %   Platelets 165 150 - 400 K/uL   Neutrophils Relative % 69 43 - 77 %   Neutro Abs 6.1 1.7 - 7.7 K/uL   Lymphocytes Relative 20 12 - 46 %   Lymphs Abs 1.7 0.7 - 4.0 K/uL   Monocytes Relative 10 3 - 12 %   Monocytes Absolute 0.8 0.1 - 1.0 K/uL   Eosinophils Relative 1 0 - 5 %   Eosinophils Absolute 0.0 0.0 - 0.7 K/uL   Basophils Relative 0 0 - 1 %   Basophils Absolute 0.0 0.0 - 0.1 K/uL  Comprehensive metabolic panel     Status: Abnormal   Collection Time: 06/23/14  3:33 PM  Result Value Ref Range   Sodium 137 135 - 145 mmol/L   Potassium 2.8 (L) 3.5 - 5.1 mmol/L   Chloride 107 101 - 111 mmol/L   CO2 23 22 - 32 mmol/L   Glucose, Bld 125 (H) 65 - 99 mg/dL   BUN 5 (L) 6 - 20 mg/dL   Creatinine, Ser 0.40 (L) 0.44 - 1.00 mg/dL   Calcium 8.7 (L) 8.9 - 10.3 mg/dL   Total Protein 5.9 (L) 6.5 - 8.1 g/dL   Albumin 2.9 (L) 3.5 - 5.0 g/dL   AST 23 15 - 41 U/L   ALT 25 14 - 54 U/L   Alkaline Phosphatase 101 38 - 126 U/L   Total Bilirubin 0.3 0.3 - 1.2 mg/dL   GFR calc non Af Amer >60 >60 mL/min   GFR calc Af Amer >60 >60 mL/min   Anion gap 7 5 - 15  Uric acid     Status: None    Collection Time: 06/23/14  3:33 PM  Result Value  Ref Range   Uric Acid, Serum 2.5 2.3 - 6.6 mg/dL  Lactate dehydrogenase     Status: None   Collection Time: 06/23/14  3:33 PM  Result Value Ref Range   LDH 146 98 - 192 U/L    Patient Vitals for the past 24 hrs:  BP Temp Pulse Resp  06/23/14 1552 (!) 116/52 mmHg - 79 -  06/23/14 1537 123/72 mmHg - 89 -  06/23/14 1522 119/83 mmHg - 96 -  06/23/14 1510 119/73 mmHg 98.1 F (36.7 C) 83 18   Fetal Monitoring: Baseline: 130 bpm, moderate variability, + accelerations, no decelerations Contractions: none  MAU Course  Procedures None  MDM UA, CBC, CMP, Uric Acid, LDH and urine protein/creatinine ratio Serial BPs Discussed patient, VS and lab results with Dr. Philis Pique. Agrees with plan to discharge at this time as BP has normalized and labs are negative for signs of pre-eclampsia  Assessment and Plan  A: SIUP at [redacted]w[redacted]d Elevated blood pressure  P: Discharge home Warning signs for HTN and pre-eclampsia discussed Patient advised to follow-up with Dr. Philis Pique next week  Patient may return to MAU as needed or if her condition were to change or worsen   Luvenia Redden, PA-C  06/23/2014, 4:10 PM

## 2014-07-01 ENCOUNTER — Encounter (HOSPITAL_COMMUNITY): Payer: Self-pay

## 2014-07-01 ENCOUNTER — Inpatient Hospital Stay (HOSPITAL_COMMUNITY)
Admission: AD | Admit: 2014-07-01 | Discharge: 2014-07-01 | Disposition: A | Discharge status: Home / Self Care | Payer: PRIVATE HEALTH INSURANCE | Source: Ambulatory Visit | Attending: Obstetrics and Gynecology | Admitting: Obstetrics and Gynecology

## 2014-07-01 DIAGNOSIS — D219 Benign neoplasm of connective and other soft tissue, unspecified: Secondary | ICD-10-CM | POA: Diagnosis not present

## 2014-07-01 DIAGNOSIS — O133 Gestational [pregnancy-induced] hypertension without significant proteinuria, third trimester: Secondary | ICD-10-CM | POA: Diagnosis present

## 2014-07-01 DIAGNOSIS — N8320 Unspecified ovarian cysts: Secondary | ICD-10-CM | POA: Diagnosis not present

## 2014-07-01 DIAGNOSIS — Z79899 Other long term (current) drug therapy: Secondary | ICD-10-CM | POA: Insufficient documentation

## 2014-07-01 DIAGNOSIS — Z3A38 38 weeks gestation of pregnancy: Secondary | ICD-10-CM | POA: Diagnosis not present

## 2014-07-01 LAB — URINALYSIS, ROUTINE W REFLEX MICROSCOPIC
BILIRUBIN URINE: NEGATIVE
Glucose, UA: NEGATIVE mg/dL
Hgb urine dipstick: NEGATIVE
Ketones, ur: NEGATIVE mg/dL
LEUKOCYTES UA: NEGATIVE
NITRITE: NEGATIVE
Protein, ur: NEGATIVE mg/dL
Specific Gravity, Urine: <1.005 — ABNORMAL LOW (ref 1.005–1.030)
Urobilinogen, UA: 0.2 mg/dL (ref 0.0–1.0)
pH: 6.5 (ref 5.0–8.0)

## 2014-07-01 LAB — COMPREHENSIVE METABOLIC PANEL
ALT: 21 U/L (ref 14–54)
AST: 23 U/L (ref 15–41)
Albumin: 2.9 g/dL — ABNORMAL LOW (ref 3.5–5.0)
Alkaline Phosphatase: 109 U/L (ref 38–126)
Anion gap: 7 (ref 5–15)
BUN: <5 mg/dL — ABNORMAL LOW (ref 6–20)
CO2: 23 mmol/L (ref 22–32)
Calcium: 8.7 mg/dL — ABNORMAL LOW (ref 8.9–10.3)
Chloride: 106 mmol/L (ref 101–111)
Creatinine, Ser: 0.39 mg/dL — ABNORMAL LOW (ref 0.44–1.00)
GFR calc Af Amer: >60 mL/min (ref >60)
GFR calc non Af Amer: >60 mL/min (ref >60)
Glucose, Bld: 101 mg/dL — ABNORMAL HIGH (ref 65–99)
Potassium: 2.8 mmol/L — ABNORMAL LOW (ref 3.5–5.1)
Sodium: 136 mmol/L (ref 135–145)
Total Bilirubin: 0.3 mg/dL (ref 0.3–1.2)
Total Protein: 5.6 g/dL — ABNORMAL LOW (ref 6.5–8.1)

## 2014-07-01 LAB — PROTEIN / CREATININE RATIO, URINE
Creatinine, Urine: 21 mg/dL
Total Protein, Urine: <6 mg/dL

## 2014-07-01 LAB — CBC
HCT: 28.9 % — ABNORMAL LOW (ref 36.0–46.0)
Hemoglobin: 9.1 g/dL — ABNORMAL LOW (ref 12.0–15.0)
MCH: 25.3 pg — ABNORMAL LOW (ref 26.0–34.0)
MCHC: 31.5 g/dL (ref 30.0–36.0)
MCV: 80.5 fL (ref 78.0–100.0)
Platelets: 181 10*3/uL (ref 150–400)
RBC: 3.59 MIL/uL — ABNORMAL LOW (ref 3.87–5.11)
RDW: 15.5 % (ref 11.5–15.5)
WBC: 9.1 10*3/uL (ref 4.0–10.5)

## 2014-07-01 NOTE — MAU Note (Signed)
Feeling dizzy and having headache for hour and half. B/p was up last wk. Took B/P at pharmacy and was 155/86. Denies visual changes. Some nausea.

## 2014-07-01 NOTE — MAU Provider Note (Signed)
History     CSN: [id number]  Arrival date and time: 07/01/14 2000   First Provider Initiated Contact with Patient 07/01/14 2049      Chief Complaint  Patient presents with  . Hypertension   Hypertension This is a recurrent problem. The current episode started today. The problem has been resolved since onset. The problem is controlled. Associated symptoms include headaches. Pertinent negatives include no blurred vision. There are no associated agents to hypertension. There are no known risk factors for coronary artery disease. Past treatments include nothing.    34 y.o. G2P1001 [redacted]w[redacted]d presents to the MAU for evaluation of her blood pressure. She has a headache currently, no visual disturbances. No epigastric pain. Denies vaginal bleeding, contractions, loss of fluid  Past Medical History  Diagnosis Date  . Ovarian cyst   . Fibroids     Past Surgical History  Procedure Laterality Date  . Cesarean section  2008  . Knee reconstruction, medial patellar femoral ligament  1999  . Appendectomy    . Intrauterine device insertion  12/2009    mirena  . Hysteroscopy with resectoscope N/A 06/17/2013    Procedure: HYSTEROSCOPY WITH RESECTOSCOPE;  Surgeon: Terrance Mass, MD;  Location: [facility];  Service: Gynecology;  Laterality: N/A;  RESECTOSCOPIC MYOMECTOMY WITH MYOSURE-REP TO BE PRESENT.    . Dilation and evacuation N/A 06/17/2013    Procedure: DILATATION AND EVACUATION;  Surgeon: Terrance Mass, MD;  Location: [facility];  Service: Gynecology;  Laterality: N/A;    Family History  Problem Relation Age of Onset  . Hypertension Mother     History  Substance Use Topics  . Smoking status: Former Smoker    Quit date: 11/02/2013  . Smokeless tobacco: Never Used  . Alcohol Use: No    Allergies:  Allergies  Allergen Reactions  . Tetracyclines & Related Hives and Rash    Prescriptions prior to admission  Medication Sig Dispense Refill Last Dose  . clonazePAM (KLONOPIN) 2 MG tablet  Take 2 mg by mouth 2 (two) times daily as needed for anxiety.   06/17/2013 at Unknown time  . metoCLOPramide (REGLAN) 10 MG tablet Take 1 tablet (10 mg total) by mouth 3 (three) times daily with meals. 30 tablet 1   . VYVANSE 50 MG capsule Take 50 mg by mouth daily.   06/16/2013 at Unknown time    Review of Systems  Constitutional: Negative for fever.  Eyes: Negative for blurred vision, double vision and photophobia.  Gastrointestinal: Negative for nausea, vomiting and abdominal pain.  Neurological: Positive for headaches.  All other systems reviewed and are negative.  Physical Exam   Blood pressure 127/77, pulse 84, temperature 98.2 F (36.8 C), resp. rate 20, height 5\' 3"  (1.6 m), weight 96.525 kg (212 lb 12.8 oz). 07/01/14 2147  --  73  --  --  123/70 mmHg  --  --  --  -- CB     07/01/14 2132  --  70  --  --  131/70 mmHg  --  --  --  -- CB    07/01/14 2117  --  73  --  --  111/68 mmHg  --  --  --  -- CB    07/01/14 2102  --  81  --  --  128/72 mmHg  --  --  --  -- CB    07/01/14 2047  --  84  --  --  120/74 mmHg  --  --  --  -- EP  07/01/14 2030  --  84  --  --  127/77 mmHg  --  --  --  -- EP    07/01/14 2025  --  90  --  20  113/83 mmHg  --  --  --  -- EP    07/01/14 2013  --  84  --  --  130/74 mmHg  --  --  --  -- JB    07/01/14 2012  98.2 F (36.8 C)  --  --  20  --              Physical Exam  Nursing note and vitals reviewed. Constitutional: She is oriented to person, place, and time. She appears well-developed and well-nourished. No distress.  HENT:  Head: Normocephalic and atraumatic.  Neck: Normal range of motion.  Cardiovascular: Normal rate.   Respiratory: Effort normal. No respiratory distress.  GI: Soft. She exhibits no mass. There is no tenderness. There is no rebound and no guarding.  Musculoskeletal: Normal range of motion.  Neurological: She is alert and oriented to person, place, and time. She has normal reflexes. She displays normal reflexes.  Skin:  Skin is warm and dry.  Psychiatric: She has a normal mood and affect. Her behavior is normal. Judgment and thought content normal.   Results for orders placed or performed during the hospital encounter of 07/01/14 (from the past 24 hour(s))  Urinalysis, Routine w reflex microscopic     Status: Abnormal   Collection Time: 07/01/14  8:17 PM  Result Value Ref Range   Color, Urine YELLOW YELLOW   APPearance CLEAR CLEAR   Specific Gravity, Urine <1.005 (L) 1.005 - 1.030   pH 6.5 5.0 - 8.0   Glucose, UA NEGATIVE NEGATIVE mg/dL   Hgb urine dipstick NEGATIVE NEGATIVE   Bilirubin Urine NEGATIVE NEGATIVE   Ketones, ur NEGATIVE NEGATIVE mg/dL   Protein, ur NEGATIVE NEGATIVE mg/dL   Urobilinogen, UA 0.2 0.0 - 1.0 mg/dL   Nitrite NEGATIVE NEGATIVE   Leukocytes, UA NEGATIVE NEGATIVE  Protein / creatinine ratio, urine     Status: None   Collection Time: 07/01/14  8:17 PM  Result Value Ref Range   Creatinine, Urine 21.00 mg/dL   Total Protein, Urine <6 mg/dL   Protein Creatinine Ratio        0.00 - 0.15 mg/mg[Cre]  CBC     Status: Abnormal   Collection Time: 07/01/14  9:35 PM  Result Value Ref Range   WBC 9.1 4.0 - 10.5 K/uL   RBC 3.59 (L) 3.87 - 5.11 MIL/uL   Hemoglobin 9.1 (L) 12.0 - 15.0 g/dL   HCT 28.9 (L) 36.0 - 46.0 %   MCV 80.5 78.0 - 100.0 fL   MCH 25.3 (L) 26.0 - 34.0 pg   MCHC 31.5 30.0 - 36.0 g/dL   RDW 15.5 11.5 - 15.5 %   Platelets 181 150 - 400 K/uL  Comprehensive metabolic panel     Status: Abnormal   Collection Time: 07/01/14  9:35 PM  Result Value Ref Range   Sodium 136 135 - 145 mmol/L   Potassium 2.8 (L) 3.5 - 5.1 mmol/L   Chloride 106 101 - 111 mmol/L   CO2 23 22 - 32 mmol/L   Glucose, Bld 101 (H) 65 - 99 mg/dL   BUN <5 (L) 6 - 20 mg/dL   Creatinine, Ser 0.39 (L) 0.44 - 1.00 mg/dL   Calcium 8.7 (L) 8.9 - 10.3 mg/dL   Total Protein 5.6 (L) 6.5 -  8.1 g/dL   Albumin 2.9 (L) 3.5 - 5.0 g/dL   AST 23 15 - 41 U/L   ALT 21 14 - 54 U/L   Alkaline Phosphatase 109 38 -  126 U/L   Total Bilirubin 0.3 0.3 - 1.2 mg/dL   GFR calc non Af Amer >60 >60 mL/min   GFR calc Af Amer >60 >60 mL/min   Anion gap 7 5 - 15    MAU Course  Procedures  MDM Serial blood pressures and Preelabs to R/O preeclampsia. Fht reactive and is unaffected by this current problem. Spoke to Dr. Rogue Bussing and pt will be discharged to home  Assessment and Plan  IUP @[redacted]w[redacted]d  -Stable Gestational hypertension  Discharge to home F/u with obgyn at next regular scheduled appt.  Matt Delpizzo Grissett 07/01/2014, 8:53 PM

## 2014-07-01 NOTE — Discharge Instructions (Signed)

## 2014-07-20 ENCOUNTER — Inpatient Hospital Stay (HOSPITAL_COMMUNITY)
Admission: AD | Admit: 2014-07-20 | Discharge: 2014-07-23 | DRG: 766 | Disposition: A | Discharge status: Home / Self Care | Payer: PRIVATE HEALTH INSURANCE | Source: Ambulatory Visit | Attending: Obstetrics | Admitting: Obstetrics

## 2014-07-20 ENCOUNTER — Encounter (HOSPITAL_COMMUNITY): Payer: Self-pay | Admitting: *Deleted

## 2014-07-20 DIAGNOSIS — Z87891 Personal history of nicotine dependence: Secondary | ICD-10-CM | POA: Diagnosis not present

## 2014-07-20 DIAGNOSIS — Z3A41 41 weeks gestation of pregnancy: Secondary | ICD-10-CM | POA: Diagnosis present

## 2014-07-20 DIAGNOSIS — O3421 Maternal care for scar from previous cesarean delivery: Principal | ICD-10-CM | POA: Diagnosis present

## 2014-07-20 DIAGNOSIS — O3413 Maternal care for benign tumor of corpus uteri, third trimester: Secondary | ICD-10-CM | POA: Diagnosis present

## 2014-07-20 DIAGNOSIS — O480 Post-term pregnancy: Secondary | ICD-10-CM | POA: Diagnosis present

## 2014-07-20 DIAGNOSIS — IMO0002 Reserved for concepts with insufficient information to code with codable children: Secondary | ICD-10-CM | POA: Diagnosis present

## 2014-07-20 DIAGNOSIS — D259 Leiomyoma of uterus, unspecified: Secondary | ICD-10-CM | POA: Diagnosis present

## 2014-07-20 DIAGNOSIS — Z98891 History of uterine scar from previous surgery: Secondary | ICD-10-CM

## 2014-07-20 LAB — URINALYSIS, DIPSTICK ONLY
Bilirubin Urine: NEGATIVE
Glucose, UA: NEGATIVE mg/dL
Hgb urine dipstick: NEGATIVE
Ketones, ur: 15 mg/dL — AB
Leukocytes, UA: NEGATIVE
NITRITE: NEGATIVE
PROTEIN: NEGATIVE mg/dL
Specific Gravity, Urine: 1.01 (ref 1.005–1.030)
Urobilinogen, UA: 0.2 mg/dL (ref 0.0–1.0)
pH: 6 (ref 5.0–8.0)

## 2014-07-20 LAB — CBC
HCT: 31.1 % — ABNORMAL LOW (ref 36.0–46.0)
Hemoglobin: 9.9 g/dL — ABNORMAL LOW (ref 12.0–15.0)
MCH: 24.8 pg — ABNORMAL LOW (ref 26.0–34.0)
MCHC: 31.8 g/dL (ref 30.0–36.0)
MCV: 77.8 fL — ABNORMAL LOW (ref 78.0–100.0)
Platelets: 187 10*3/uL (ref 150–400)
RBC: 4 MIL/uL (ref 3.87–5.11)
RDW: 16 % — AB (ref 11.5–15.5)
WBC: 10.3 10*3/uL (ref 4.0–10.5)

## 2014-07-20 LAB — COMPREHENSIVE METABOLIC PANEL
ALBUMIN: 3.2 g/dL — AB (ref 3.5–5.0)
ALT: 19 U/L (ref 14–54)
AST: 23 U/L (ref 15–41)
Alkaline Phosphatase: 135 U/L — ABNORMAL HIGH (ref 38–126)
Anion gap: 6 (ref 5–15)
BUN: 5 mg/dL — ABNORMAL LOW (ref 6–20)
CO2: 22 mmol/L (ref 22–32)
Calcium: 9.2 mg/dL (ref 8.9–10.3)
Chloride: 108 mmol/L (ref 101–111)
Creatinine, Ser: 0.42 mg/dL — ABNORMAL LOW (ref 0.44–1.00)
GFR calc Af Amer: >60 mL/min (ref >60)
Glucose, Bld: 94 mg/dL (ref 65–99)
Potassium: 3.1 mmol/L — ABNORMAL LOW (ref 3.5–5.1)
Sodium: 136 mmol/L (ref 135–145)
Total Bilirubin: 0.3 mg/dL (ref 0.3–1.2)
Total Protein: 5.9 g/dL — ABNORMAL LOW (ref 6.5–8.1)

## 2014-07-20 LAB — LACTATE DEHYDROGENASE: LDH: 178 U/L (ref 98–192)

## 2014-07-20 LAB — TYPE AND SCREEN
ABO/RH(D): O POS
Antibody Screen: NEGATIVE

## 2014-07-20 LAB — URIC ACID: Uric Acid, Serum: 2.9 mg/dL (ref 2.3–6.6)

## 2014-07-20 MED ORDER — LACTATED RINGERS IV SOLN
INTRAVENOUS | Status: DC
  Administered 2014-07-20 – 2014-07-21 (×3): via INTRAVENOUS

## 2014-07-20 MED ORDER — FLEET ENEMA 7-19 GM/118ML RE ENEM: 1.0000 | ENEMA | Freq: Once | RECTAL | Status: DC

## 2014-07-20 MED ORDER — OXYCODONE-ACETAMINOPHEN 5-325 MG PO TABS: 1.0000 | ORAL_TABLET | ORAL | Status: DC | PRN

## 2014-07-20 MED ORDER — ONDANSETRON HCL 4 MG/2ML IJ SOLN: 4.0000 mg | Freq: Four times a day (QID) | INTRAMUSCULAR | Status: DC | PRN

## 2014-07-20 MED ORDER — OXYTOCIN 40 UNITS IN LACTATED RINGERS INFUSION - SIMPLE MED
1.0000 m[IU]/min | INTRAVENOUS | Status: DC
  Administered 2014-07-20: 1 m[IU]/min via INTRAVENOUS

## 2014-07-20 MED ORDER — ACETAMINOPHEN 325 MG PO TABS: 650.0000 mg | ORAL_TABLET | ORAL | Status: DC | PRN

## 2014-07-20 MED ORDER — OXYTOCIN 40 UNITS IN LACTATED RINGERS INFUSION - SIMPLE MED
62.5000 mL/h | INTRAVENOUS | Status: DC
  Filled 2014-07-20: qty 1000

## 2014-07-20 MED ORDER — OXYTOCIN BOLUS FROM INFUSION: 500.0000 mL | INTRAVENOUS | Status: DC

## 2014-07-20 MED ORDER — LIDOCAINE HCL (PF) 1 % IJ SOLN
30.0000 mL | INTRAMUSCULAR | Status: DC | PRN
  Filled 2014-07-20: qty 30

## 2014-07-20 MED ORDER — LACTATED RINGERS IV SOLN: 500.0000 mL | INTRAVENOUS | Status: DC | PRN

## 2014-07-20 MED ORDER — TERBUTALINE SULFATE 1 MG/ML IJ SOLN: 0.2500 mg | Freq: Once | INTRAMUSCULAR | Status: AC | PRN

## 2014-07-20 MED ORDER — CITRIC ACID-SODIUM CITRATE 334-500 MG/5ML PO SOLN
30.0000 mL | ORAL | Status: DC | PRN
  Administered 2014-07-21: 30 mL via ORAL
  Filled 2014-07-20: qty 15

## 2014-07-20 MED ORDER — OXYCODONE-ACETAMINOPHEN 5-325 MG PO TABS: 2.0000 | ORAL_TABLET | ORAL | Status: DC | PRN

## 2014-07-20 NOTE — MAU Note (Signed)
Pt scheduled for induction at 1915 and labor and delivery unable to take patient at this time.

## 2014-07-21 ENCOUNTER — Encounter (HOSPITAL_COMMUNITY): Payer: Self-pay

## 2014-07-21 ENCOUNTER — Inpatient Hospital Stay (HOSPITAL_COMMUNITY): Payer: PRIVATE HEALTH INSURANCE | Admitting: Anesthesiology

## 2014-07-21 ENCOUNTER — Encounter (HOSPITAL_COMMUNITY)
Admission: AD | Disposition: A | Discharge status: Home / Self Care | Payer: Self-pay | Source: Ambulatory Visit | Attending: Obstetrics

## 2014-07-21 DIAGNOSIS — Z98891 History of uterine scar from previous surgery: Secondary | ICD-10-CM

## 2014-07-21 LAB — RPR: RPR Ser Ql: NONREACTIVE

## 2014-07-21 LAB — ABO/RH: ABO/RH(D): O POS

## 2014-07-21 SURGERY — Surgical Case
Anesthesia: Epidural | Site: Abdomen

## 2014-07-21 MED ORDER — MEPERIDINE HCL 25 MG/ML IJ SOLN: 6.2500 mg | INTRAMUSCULAR | Status: DC | PRN

## 2014-07-21 MED ORDER — OXYTOCIN 10 UNIT/ML IJ SOLN
INTRAMUSCULAR | Status: AC
Start: 2014-07-21 — End: 2014-07-21
  Filled 2014-07-21: qty 4

## 2014-07-21 MED ORDER — OXYTOCIN 40 UNITS IN LACTATED RINGERS INFUSION - SIMPLE MED: 62.5000 mL/h | INTRAVENOUS | Status: AC

## 2014-07-21 MED ORDER — OXYCODONE-ACETAMINOPHEN 5-325 MG PO TABS
1.0000 | ORAL_TABLET | ORAL | Status: DC | PRN
  Administered 2014-07-21 (×2): 1 via ORAL
  Filled 2014-07-21 (×4): qty 1

## 2014-07-21 MED ORDER — MORPHINE SULFATE (PF) 0.5 MG/ML IJ SOLN
INTRAMUSCULAR | Status: DC | PRN
  Administered 2014-07-21: .1 mg via INTRATHECAL

## 2014-07-21 MED ORDER — ACETAMINOPHEN 325 MG PO TABS: 650.0000 mg | ORAL_TABLET | ORAL | Status: DC | PRN

## 2014-07-21 MED ORDER — NALBUPHINE HCL 10 MG/ML IJ SOLN
INTRAMUSCULAR | Status: AC
  Filled 2014-07-21: qty 1

## 2014-07-21 MED ORDER — IBUPROFEN 600 MG PO TABS
600.0000 mg | ORAL_TABLET | Freq: Four times a day (QID) | ORAL | Status: DC
  Administered 2014-07-21 – 2014-07-23 (×7): 600 mg via ORAL
  Filled 2014-07-21 (×8): qty 1

## 2014-07-21 MED ORDER — PRENATAL MULTIVITAMIN CH
1.0000 | ORAL_TABLET | Freq: Every day | ORAL | Status: DC
  Administered 2014-07-22 – 2014-07-23 (×2): 1 via ORAL
  Filled 2014-07-21 (×2): qty 1

## 2014-07-21 MED ORDER — SIMETHICONE 80 MG PO CHEW
80.0000 mg | CHEWABLE_TABLET | Freq: Three times a day (TID) | ORAL | Status: DC
  Administered 2014-07-21 – 2014-07-23 (×7): 80 mg via ORAL
  Filled 2014-07-21 (×7): qty 1

## 2014-07-21 MED ORDER — LANOLIN HYDROUS EX OINT: 1.0000 "application " | TOPICAL_OINTMENT | CUTANEOUS | Status: DC | PRN

## 2014-07-21 MED ORDER — WITCH HAZEL-GLYCERIN EX PADS: 1.0000 "application " | MEDICATED_PAD | CUTANEOUS | Status: DC | PRN

## 2014-07-21 MED ORDER — SCOPOLAMINE 1 MG/3DAYS TD PT72
MEDICATED_PATCH | TRANSDERMAL | Status: AC
  Filled 2014-07-21: qty 1

## 2014-07-21 MED ORDER — ONDANSETRON HCL 4 MG/2ML IJ SOLN
INTRAMUSCULAR | Status: DC | PRN
  Administered 2014-07-21: 4 mg via INTRAVENOUS

## 2014-07-21 MED ORDER — FAMOTIDINE 20 MG PO TABS
20.0000 mg | ORAL_TABLET | Freq: Two times a day (BID) | ORAL | Status: DC
  Administered 2014-07-21 – 2014-07-23 (×5): 20 mg via ORAL
  Filled 2014-07-21 (×5): qty 1

## 2014-07-21 MED ORDER — CEFAZOLIN SODIUM-DEXTROSE 2-3 GM-% IV SOLR
2.0000 g | INTRAVENOUS | Status: AC
  Administered 2014-07-21: 2 g via INTRAVENOUS
  Filled 2014-07-21: qty 50

## 2014-07-21 MED ORDER — KETOROLAC TROMETHAMINE 30 MG/ML IJ SOLN
30.0000 mg | Freq: Once | INTRAMUSCULAR | Status: AC
  Administered 2014-07-21: 30 mg via INTRAMUSCULAR

## 2014-07-21 MED ORDER — NALBUPHINE HCL 10 MG/ML IJ SOLN: 5.0000 mg | Freq: Once | INTRAMUSCULAR | Status: AC | PRN

## 2014-07-21 MED ORDER — ONDANSETRON HCL 4 MG/2ML IJ SOLN: 4.0000 mg | Freq: Three times a day (TID) | INTRAMUSCULAR | Status: DC | PRN

## 2014-07-21 MED ORDER — HYDROMORPHONE HCL 1 MG/ML IJ SOLN
0.2500 mg | INTRAMUSCULAR | Status: DC | PRN
  Administered 2014-07-21: 0.25 mg via INTRAVENOUS

## 2014-07-21 MED ORDER — CEFAZOLIN SODIUM-DEXTROSE 2-3 GM-% IV SOLR
INTRAVENOUS | Status: AC
  Filled 2014-07-21: qty 50

## 2014-07-21 MED ORDER — FENTANYL CITRATE (PF) 100 MCG/2ML IJ SOLN
INTRAMUSCULAR | Status: DC | PRN
  Administered 2014-07-21: 25 ug via INTRATHECAL

## 2014-07-21 MED ORDER — OXYTOCIN 10 UNIT/ML IJ SOLN
40.0000 [IU] | INTRAVENOUS | Status: DC | PRN
  Administered 2014-07-21: 40 [IU] via INTRAVENOUS

## 2014-07-21 MED ORDER — PHENYLEPHRINE 8 MG IN D5W 100 ML (0.08MG/ML) PREMIX OPTIME
INJECTION | INTRAVENOUS | Status: DC | PRN
  Administered 2014-07-21: 60 ug/min via INTRAVENOUS

## 2014-07-21 MED ORDER — KETOROLAC TROMETHAMINE 30 MG/ML IJ SOLN
30.0000 mg | Freq: Four times a day (QID) | INTRAMUSCULAR | Status: AC
  Administered 2014-07-21: 30 mg via INTRAVENOUS
  Filled 2014-07-21: qty 1

## 2014-07-21 MED ORDER — SCOPOLAMINE 1 MG/3DAYS TD PT72
1.0000 | MEDICATED_PATCH | Freq: Once | TRANSDERMAL | Status: DC
Start: 2014-07-21 — End: 2014-07-23
  Filled 2014-07-21: qty 1

## 2014-07-21 MED ORDER — NALOXONE HCL 0.4 MG/ML IJ SOLN: 0.4000 mg | INTRAMUSCULAR | Status: DC | PRN

## 2014-07-21 MED ORDER — DIPHENHYDRAMINE HCL 25 MG PO CAPS
25.0000 mg | ORAL_CAPSULE | Freq: Four times a day (QID) | ORAL | Status: DC | PRN
  Filled 2014-07-21: qty 1

## 2014-07-21 MED ORDER — ONDANSETRON HCL 4 MG/2ML IJ SOLN
INTRAMUSCULAR | Status: AC
  Filled 2014-07-21: qty 2

## 2014-07-21 MED ORDER — SIMETHICONE 80 MG PO CHEW: 80.0000 mg | CHEWABLE_TABLET | ORAL | Status: DC | PRN

## 2014-07-21 MED ORDER — MORPHINE SULFATE 0.5 MG/ML IJ SOLN
INTRAMUSCULAR | Status: AC
  Filled 2014-07-21: qty 10

## 2014-07-21 MED ORDER — DIPHENHYDRAMINE HCL 50 MG/ML IJ SOLN: 12.5000 mg | INTRAMUSCULAR | Status: DC | PRN

## 2014-07-21 MED ORDER — SIMETHICONE 80 MG PO CHEW
80.0000 mg | CHEWABLE_TABLET | ORAL | Status: DC
Start: 2014-07-22 — End: 2014-07-23
  Administered 2014-07-21 – 2014-07-22 (×2): 80 mg via ORAL
  Filled 2014-07-21 (×2): qty 1

## 2014-07-21 MED ORDER — SODIUM CHLORIDE 0.9 % IJ SOLN: 3.0000 mL | INTRAMUSCULAR | Status: DC | PRN

## 2014-07-21 MED ORDER — NALBUPHINE HCL 10 MG/ML IJ SOLN: 5.0000 mg | INTRAMUSCULAR | Status: DC | PRN

## 2014-07-21 MED ORDER — SCOPOLAMINE 1 MG/3DAYS TD PT72
MEDICATED_PATCH | TRANSDERMAL | Status: DC | PRN
  Administered 2014-07-21: 1 via TRANSDERMAL

## 2014-07-21 MED ORDER — KETOROLAC TROMETHAMINE 30 MG/ML IJ SOLN
INTRAMUSCULAR | Status: AC
  Administered 2014-07-21: 30 mg via INTRAMUSCULAR
  Filled 2014-07-21: qty 1

## 2014-07-21 MED ORDER — TETANUS-DIPHTH-ACELL PERTUSSIS 5-2.5-18.5 LF-MCG/0.5 IM SUSP: 0.5000 mL | Freq: Once | INTRAMUSCULAR | Status: DC

## 2014-07-21 MED ORDER — LACTATED RINGERS IV SOLN
INTRAVENOUS | Status: DC
  Administered 2014-07-21: 1 mL via INTRAVENOUS
  Administered 2014-07-22: 01:00:00 via INTRAVENOUS

## 2014-07-21 MED ORDER — NALBUPHINE HCL 10 MG/ML IJ SOLN
5.0000 mg | Freq: Once | INTRAMUSCULAR | Status: AC | PRN
  Administered 2014-07-21: 5 mg via INTRAVENOUS
  Filled 2014-07-21: qty 1

## 2014-07-21 MED ORDER — OXYCODONE-ACETAMINOPHEN 5-325 MG PO TABS
2.0000 | ORAL_TABLET | ORAL | Status: DC | PRN
  Administered 2014-07-22 – 2014-07-23 (×7): 2 via ORAL
  Filled 2014-07-21 (×6): qty 2

## 2014-07-21 MED ORDER — SENNOSIDES-DOCUSATE SODIUM 8.6-50 MG PO TABS
2.0000 | ORAL_TABLET | ORAL | Status: DC
Start: 2014-07-22 — End: 2014-07-23
  Administered 2014-07-21 – 2014-07-22 (×2): 2 via ORAL
  Filled 2014-07-21 (×3): qty 2

## 2014-07-21 MED ORDER — NALBUPHINE HCL 10 MG/ML IJ SOLN
5.0000 mg | INTRAMUSCULAR | Status: DC | PRN
  Administered 2014-07-21: 5 mg via SUBCUTANEOUS

## 2014-07-21 MED ORDER — ACETAMINOPHEN 500 MG PO TABS: 1000.0000 mg | ORAL_TABLET | Freq: Four times a day (QID) | ORAL | Status: DC

## 2014-07-21 MED ORDER — HYDROMORPHONE HCL 1 MG/ML IJ SOLN
INTRAMUSCULAR | Status: AC
  Administered 2014-07-21: 0.25 mg via INTRAVENOUS
  Filled 2014-07-21: qty 1

## 2014-07-21 MED ORDER — MENTHOL 3 MG MT LOZG
1.0000 | LOZENGE | OROMUCOSAL | Status: DC | PRN
Start: 2014-07-21 — End: 2014-07-23

## 2014-07-21 MED ORDER — DIPHENHYDRAMINE HCL 25 MG PO CAPS
25.0000 mg | ORAL_CAPSULE | ORAL | Status: DC | PRN
  Administered 2014-07-21: 25 mg via ORAL

## 2014-07-21 MED ORDER — LACTATED RINGERS IV SOLN
INTRAVENOUS | Status: DC | PRN
  Administered 2014-07-21: 10:00:00 via INTRAVENOUS

## 2014-07-21 MED ORDER — FENTANYL CITRATE (PF) 100 MCG/2ML IJ SOLN
INTRAMUSCULAR | Status: AC
  Filled 2014-07-21: qty 2

## 2014-07-21 MED ORDER — DEXTROSE 5 % IV SOLN
1.0000 ug/kg/h | INTRAVENOUS | Status: DC | PRN
  Filled 2014-07-21: qty 2

## 2014-07-21 MED ORDER — DIBUCAINE 1 % RE OINT: 1.0000 "application " | TOPICAL_OINTMENT | RECTAL | Status: DC | PRN

## 2014-07-21 MED ORDER — OXYTOCIN 10 UNIT/ML IJ SOLN: 40.0000 [IU] | INTRAVENOUS | Status: DC | PRN

## 2014-07-21 MED ORDER — 0.9 % SODIUM CHLORIDE (POUR BTL) OPTIME
TOPICAL | Status: DC | PRN
  Administered 2014-07-21: 1000 mL

## 2014-07-21 SURGICAL SUPPLY — 36 items
BENZOIN TINCTURE PRP APPL 2/3 (GAUZE/BANDAGES/DRESSINGS) ×3 IMPLANT
CLAMP CORD UMBIL (MISCELLANEOUS) IMPLANT
CLOSURE WOUND 1/2 X4 (GAUZE/BANDAGES/DRESSINGS) ×1
CLOTH BEACON ORANGE TIMEOUT ST (SAFETY) ×3 IMPLANT
DRAPE SHEET LG 3/4 BI-LAMINATE (DRAPES) IMPLANT
DRSG OPSITE POSTOP 4X10 (GAUZE/BANDAGES/DRESSINGS) ×3 IMPLANT
DURAPREP 26ML APPLICATOR (WOUND CARE) ×3 IMPLANT
ELECT REM PT RETURN 9FT ADLT (ELECTROSURGICAL) ×3
ELECTRODE REM PT RTRN 9FT ADLT (ELECTROSURGICAL) ×1 IMPLANT
EXTRACTOR VACUUM KIWI (MISCELLANEOUS) IMPLANT
GLOVE BIO SURGEON STRL SZ 6 (GLOVE) ×3 IMPLANT
GLOVE INDICATOR 6.5 STRL GRN (GLOVE) ×3 IMPLANT
GOWN STRL REUS W/TWL LRG LVL3 (GOWN DISPOSABLE) ×6 IMPLANT
KIT ABG SYR 3ML LUER SLIP (SYRINGE) IMPLANT
LIQUID BAND (GAUZE/BANDAGES/DRESSINGS) IMPLANT
NEEDLE HYPO 25X5/8 SAFETYGLIDE (NEEDLE) IMPLANT
NS IRRIG 1000ML POUR BTL (IV SOLUTION) ×3 IMPLANT
PACK C SECTION WH (CUSTOM PROCEDURE TRAY) ×3 IMPLANT
PAD ABD 7.5X8 STRL (GAUZE/BANDAGES/DRESSINGS) ×3 IMPLANT
PAD OB MATERNITY 4.3X12.25 (PERSONAL CARE ITEMS) ×3 IMPLANT
RTRCTR C-SECT PINK 25CM LRG (MISCELLANEOUS) IMPLANT
SPONGE GAUZE 4X4 12PLY STER LF (GAUZE/BANDAGES/DRESSINGS) ×6 IMPLANT
STRIP CLOSURE SKIN 1/2X4 (GAUZE/BANDAGES/DRESSINGS) ×2 IMPLANT
SUT MNCRL 0 VIOLET CTX 36 (SUTURE) ×3 IMPLANT
SUT MNCRL AB 3-0 PS2 27 (SUTURE) ×3 IMPLANT
SUT MONOCRYL 0 CTX 36 (SUTURE) ×6
SUT PLAIN 0 NONE (SUTURE) IMPLANT
SUT PLAIN 2 0 (SUTURE) ×2
SUT PLAIN ABS 2-0 CT1 27XMFL (SUTURE) ×1 IMPLANT
SUT VIC AB 0 CTX 36 (SUTURE) ×4
SUT VIC AB 0 CTX36XBRD ANBCTRL (SUTURE) ×2 IMPLANT
SUT VIC AB 2-0 CT1 27 (SUTURE) ×4
SUT VIC AB 2-0 CT1 TAPERPNT 27 (SUTURE) ×2 IMPLANT
TAPE CLOTH SURG 4X10 WHT LF (GAUZE/BANDAGES/DRESSINGS) ×3 IMPLANT
TOWEL OR 17X24 6PK STRL BLUE (TOWEL DISPOSABLE) ×3 IMPLANT
TRAY FOLEY CATH SILVER 14FR (SET/KITS/TRAYS/PACK) ×3 IMPLANT

## 2014-07-21 NOTE — Anesthesia Postprocedure Evaluation (Signed)
  Anesthesia Post-op Note  Patient: Chelsea Lam. Howser  Procedure(s) Performed: Procedure(s): CESAREAN SECTION (N/A)  Patient Location: Mother/Baby  Anesthesia Type:Spinal  Level of Consciousness: awake  Airway and Oxygen Therapy: Patient Spontanous Breathing  Post-op Pain: mild  Post-op Assessment: Patient's Cardiovascular Status Stable and Respiratory Function Stable LLE Motor Response: Purposeful movement LLE Sensation: Decreased RLE Motor Response: Purposeful movement RLE Sensation: Decreased      Post-op Vital Signs: stable  Last Vitals:  Filed Vitals:   07/21/14 1607  BP: 121/55  Pulse: 64  Temp: 36.5 C  Resp: 18    Complications: No apparent anesthesia complications

## 2014-07-21 NOTE — Transfer of Care (Signed)
Immediate Anesthesia Transfer of Care Note  Patient: Chelsea Lam  Procedure(s) Performed: Procedure(s): CESAREAN SECTION (N/A)  Patient Location: PACU  Anesthesia Type:Spinal  Level of Consciousness: awake, alert , oriented and patient cooperative  Airway & Oxygen Therapy: Patient Spontanous Breathing  Post-op Assessment: Report given to RN and Post -op Vital signs reviewed and stable  Post vital signs: Reviewed and stable  Last Vitals:  Filed Vitals:   07/21/14 1043  BP:   Pulse:   Temp: 36.6 C  Resp:     Complications: No apparent anesthesia complications

## 2014-07-21 NOTE — H&P (Signed)
34 y.o. G2P1001 @ [redacted]w[redacted]d presents who was admitted last night for TOLAC induction of labor.  G1 was failed IOL for suspected macrosomia (8#13oz).  She had a growth [place] 1.5 wks ago EFW 8lb 4oz, 85%.  SVE was closed/thick/high on admission.  She received pitocin x 10hrs.  On my arrival this AM, she indicated that she no longer desires a TOLAC and would prefer a RCS.  Otherwise has good fetal movement and no bleeding.  Past Medical History  Diagnosis Date  . Ovarian cyst   . Fibroids     Past Surgical History  Procedure Laterality Date  . Cesarean section  2008  . Knee reconstruction, medial patellar femoral ligament  1999  . Appendectomy    . Intrauterine device insertion  12/2009    mirena  . Hysteroscopy with resectoscope N/A 06/17/2013    Procedure: HYSTEROSCOPY WITH RESECTOSCOPE;  Surgeon: Terrance Mass, MD;  Location: [facility];  Service: Gynecology;  Laterality: N/A;  RESECTOSCOPIC MYOMECTOMY WITH MYOSURE-REP TO BE PRESENT.    . Dilation and evacuation N/A 06/17/2013    Procedure: DILATATION AND EVACUATION;  Surgeon: Terrance Mass, MD;  Location: [facility];  Service: Gynecology;  Laterality: N/A;    OB History  Gravida Para Term Preterm AB SAB TAB Ectopic Multiple Living  2 1 1       1     # Outcome Date GA Lbr Len/2nd Weight Sex Delivery Anes PTL Lv  2 Current           1 Term               History   Social History  . Marital Status: Married    Spouse Name: N/A  . Number of Children: N/A  . Years of Education: N/A   Occupational History  . Not on file.   Social History Main Topics  . Smoking status: Former Smoker    Types: Cigarettes    Quit date: 11/02/2013  . Smokeless tobacco: Never Used  . Alcohol Use: No  . Drug Use: No  . Sexual Activity: Yes    Birth Control/ Protection: None   Other Topics Concern  . Not on file   Social History Narrative   Tetracyclines & related    Prenatal Transfer Tool  Maternal Diabetes: No Genetic Screening: Declined Maternal  Ultrasounds/Referrals: Normal Fetal Ultrasounds or other Referrals:  Other:  growth 85% 1 wk ago Maternal Substance Abuse:  No Significant Maternal Medications:  None Significant Maternal Lab Results: Lab values include: Group B Strep negative  ABO, Rh: --/--/O POS, O POS (06/09 2215) Antibody: NEG (06/09 2215) Rubella:  immune RPR: Nonreactive (10/16 0000)  HBsAg: Negative (10/16 0000)  HIV: Non-reactive (10/16 0000)  GBS: Negative (05/06 0000)    Other PNC: uncomplicated.    Filed Vitals:   07/21/14 0837  BP:   Pulse:   Temp:   Resp: 20     General:  NAD Lungs: CTAB Cardiac: RRR Abdomen:  soft, gravid, EFW 8.5# Ex:  1+ edema SVE:  FT/long/ballotable FHTs:  130s, mod var, +accels Toco:  q2-4 min, non-painful   A/P   34 y.o. [redacted]w[redacted]d  G2P1001 presents with IOL in setting of prior c/s Received 10hrs of pitocin overnight--not an adequate trial of labor; however, Amatullah no longer desires IOL. NPO since 6 pm. Will proceed with repeat cesarean section.  Reviewed risks to include infection, bleeding, damage to surrounding structures (bowel, bladder, tubes, ovaries, vessels, nerves, baby), vte, need for blood  transfusion, need for additional procedures.   Ancef 2 gm OCTOR FSR/ vtx/ GBS neg  [place]

## 2014-07-21 NOTE — Anesthesia Preprocedure Evaluation (Signed)
Anesthesia Evaluation  Patient identified by MRN, date of birth, ID band Patient awake    Reviewed: Allergy & Precautions, H&P , Patient's Chart, lab work & pertinent test results  Airway Mallampati: II  TM Distance: >3 FB Neck ROM: full    Dental  (+) Teeth Intact   Pulmonary former smoker,  breath sounds clear to auscultation        Cardiovascular Rhythm:regular Rate:Normal     Neuro/Psych    GI/Hepatic   Endo/Other    Renal/GU      Musculoskeletal   Abdominal   Peds  Hematology  (+) anemia ,   Anesthesia Other Findings       Reproductive/Obstetrics (+) Pregnancy                             Anesthesia Physical Anesthesia Plan  ASA: III  Anesthesia Plan: Epidural   Post-op Pain Management:    Induction:   Airway Management Planned:   Additional Equipment:   Intra-op Plan:   Post-operative Plan:   Informed Consent: I have reviewed the patients History and Physical, chart, labs and discussed the procedure including the risks, benefits and alternatives for the proposed anesthesia with the patient or authorized representative who has indicated his/her understanding and acceptance.   Dental Advisory Given  Plan Discussed with:   Anesthesia Plan Comments: (Labs checked- platelets confirmed with RN in room. Fetal heart tracing, per RN, reported to be stable enough for sitting procedure. Discussed epidural, and patient consents to the procedure:  included risk of possible headache,backache, failed block, allergic reaction, and nerve injury. This patient was asked if she had any questions or concerns before the procedure started.)        Anesthesia Quick Evaluation

## 2014-07-21 NOTE — Progress Notes (Signed)
Dr [name] discussing options to pt regarding foley bulb placement and induction of labor or repeat cesarean section

## 2014-07-21 NOTE — Anesthesia Procedure Notes (Signed)
Spinal Patient location during procedure: OR Preanesthetic Checklist Completed: patient identified, site marked, surgical consent, pre-op evaluation, timeout performed, IV checked, risks and benefits discussed and monitors and equipment checked Spinal Block Patient position: sitting Prep: DuraPrep Patient monitoring: heart rate, cardiac monitor, continuous pulse ox and blood pressure Approach: midline Location: L3-4 Injection technique: single-shot Needle Needle type: Sprotte  Needle gauge: 24 G Needle length: 9 cm Assessment Sensory level: T4 Additional Notes Spinal Dosage in OR  Bupivicaine ml       1.4 PFMS04   mcg        100 Fentanyl mcg            25

## 2014-07-21 NOTE — Progress Notes (Signed)
In O.R. 

## 2014-07-21 NOTE — Op Note (Signed)
Cesarean Section Procedure Note  Pre-operative Diagnosis: 1. Intrauterine pregnancy at [redacted]w[redacted]d  2. Prior cesarean section, desires repeat after 10 hours of TOLAC.  Post-operative Diagnosis: same as above  Surgeon: Jerelyn Charles, MD  Assistants: Allyn Kenner, DO  Procedure: Repeat low transverse cesarean section   Anesthesia: Spinal anesthesia  Estimated Blood Loss: 1000 mL         Drains: Foley catheter         Specimens: none         Implants: none         Complications:  None; patient tolerated the procedure well.         Disposition: PACU - hemodynamically stable.  Findings:  Normal uterus, tubes and ovaries bilaterally.  Viable female infant, 3740g (8lb 4oz) Apgars 9, 9.    Procedure Details   The patient was initially admitted for IOL in the setting of prior cesarean section.  She strongly desired vaginal delivery.  She was closed/thick/high on admission on 6/9.  She received 10 hours of IV pitocin and was contracting moderately.  On the morning of 07/21/14, she decided that she no longer desired to pursue a TOLAC.  She elected to proceed with a repeat cesarean section.    After spinal anesthesia was found to adequate , the patient was placed in the dorsal supine position with a leftward tilt, draped and prepped in the usual sterile manner. A Pfannenstiel incision was made and carried down through the subcutaneous tissue to the fascia. The fascia was incised in the midline and the fascial incision was extended laterally with Mayo scissors. The superior aspect of the fascial incision was grasped with two Kocher clamp, tented up and the rectus muscles dissected off sharply. The rectus was then dissected off with blunt dissection and Mayo scissors inferiorly. The rectus muscles were separated in the midline.  Moderated adhesions of the omentum to the anterior abdominal wall and the rectus muscles were identified.  The were taken down sharply with the Metzenbaum scissors.  The  abdominal peritoneum was identified, tented up, entered sharply, and the incision was extended superiorly and inferiorly with good visualization of the bladder. The Alexis retractor was deployed. The vesicouterine peritoneum was identified, tented up, entered sharply, and the bladder flap was created digitally. Scalpel was then used to make a low transverse incision on the uterus which was extended in the cephalad-caudad direction with blunt dissection. The fluid was clear. The fetal vertex was identified, elevated out of the pelvis and brought to the hysterotomy. The head was delivered easily followed by the shoulders and body. The cord was clamped and cut and the infant was passed to the waiting neonatologist. Placenta was then delivered spontaneously, intact and appear normal, the uterus was cleared of all clot and debris   The hysterotomy was repaired with #0 Monocryl in running locked fashion.  The lower uterine segment was thin, and a small defect was noted at the inferior edge of the hysterotomy.  This was closed in a running fashion w #0 Monocryl.  A second imbricating layer was placed.   The hysterotomy was reexamined and excellent hemostasis was noted.  The Alexis retractor was removed from the abdomen. The peritoneum was examined and all vessels noted to be hemostatic. The abdominal cavity was cleared of all clot and debris.  The peritoneum was not closed due to the omental adhesions.  The lower edges of the rectus muscles were plicated with 3-0 vicryl in an interrupted fashion. The fascia and  rectus muscles were inspected and were hemostatic. The fascia was closed with 0 Vicryl in a running fashion. The subcuticular layer was irrigated and all bleeders cauterized.  The subcutaneous layer was re approximated with interrupted 3-0 plain gut.  The skin was closed with 3-0 monocryl in a subcuticular fashion. The incision was dressed with benzoine, steri strips and pressure dressing. All sponge lap and  needle counts were correct x3. Patient tolerated the procedure well and recovered in stable condition following the procedure.

## 2014-07-21 NOTE — Brief Op Note (Signed)
07/20/2014 - 07/21/2014  10:34 AM  PATIENT:  Chelsea Lam  34 y.o. female  PRE-OPERATIVE DIAGNOSIS:  Prior cesaren section, desires repeat  POST-OPERATIVE DIAGNOSIS:  Prior cesaren section, desires repeat  PROCEDURE:  Procedure(s): CESAREAN SECTION (N/A)  SURGEON:  Surgeon(s) and Role:    * Jerelyn Charles, MD - Primary    * Allyn Kenner, DO - Assisting  ANESTHESIA:   spinal  EBL:  Total I/O In: 1500 [I.V.:1500] Out: 1100 [Urine:100; Blood:1000]  BLOOD ADMINISTERED:none  DRAINS: none   LOCAL MEDICATIONS USED:  NONE  SPECIMEN:  No Specimen  DISPOSITION OF SPECIMEN:  N/A  COUNTS:  YES  TOURNIQUET:  * No tourniquets in log *  DICTATION: .Note written in EPIC  PLAN OF CARE: Admit to inpatient   PATIENT DISPOSITION:  PACU - hemodynamically stable.   Delay start of Pharmacological VTE agent (>24hrs) due to surgical blood loss or risk of bleeding: not applicable

## 2014-07-21 NOTE — Anesthesia Postprocedure Evaluation (Signed)
  Anesthesia Post-op Note  Patient: Chelsea Lam. [place]  Procedure(s) Performed: Procedure(s): CESAREAN SECTION (N/A)  Patient Location: PACU  Anesthesia Type:Spinal  Level of Consciousness: awake and alert   Airway and Oxygen Therapy: Patient Spontanous Breathing  Post-op Pain: none  Post-op Assessment: Post-op Vital signs reviewed LLE Motor Response: Purposeful movement LLE Sensation: Decreased RLE Motor Response: Purposeful movement RLE Sensation: Decreased      Post-op Vital Signs: Reviewed and stable  Last Vitals:  Filed Vitals:   07/21/14 1215  BP: 109/59  Pulse: 60  Temp:   Resp: 15    Complications: No apparent anesthesia complications

## 2014-07-22 LAB — CBC
HCT: 25.9 % — ABNORMAL LOW (ref 36.0–46.0)
Hemoglobin: 8.3 g/dL — ABNORMAL LOW (ref 12.0–15.0)
MCH: 24.9 pg — ABNORMAL LOW (ref 26.0–34.0)
MCHC: 32 g/dL (ref 30.0–36.0)
MCV: 77.8 fL — ABNORMAL LOW (ref 78.0–100.0)
Platelets: 157 10*3/uL (ref 150–400)
RBC: 3.33 MIL/uL — ABNORMAL LOW (ref 3.87–5.11)
RDW: 16.1 % — ABNORMAL HIGH (ref 11.5–15.5)
WBC: 7.4 10*3/uL (ref 4.0–10.5)

## 2014-07-22 LAB — CCBB MATERNAL DONOR DRAW

## 2014-07-22 LAB — BIRTH TISSUE RECOVERY COLLECTION (PLACENTA DONATION)

## 2014-07-22 NOTE — Progress Notes (Signed)
RN took patient to bathroom

## 2014-07-22 NOTE — Progress Notes (Signed)
Patient is doing well.  She is tolerating PO, ambulating.  Pain is controlled.  Lochia is appropriate.  Foley catheter removed at 0400, has not yet voided.  Filed Vitals:   07/21/14 1714 07/21/14 2006 07/21/14 2341 07/22/14 0359  BP: 126/67 119/70 110/66 99/53  Pulse: 65 52 68 55  Temp:  97.6 F (36.4 C) 97.7 F (36.5 C) 97.5 F (36.4 C)  TempSrc:  Oral Oral Oral  Resp: 18 18 18 16   Height:      Weight:      SpO2:  97% 98% 97%    NAD Abdomen:  soft, appropriate tenderness, incisions intact and without erythema or drainage ext:    Symmetric, 1+ edema bilaterally, symmetric, no calf tenderness  Lab Results  Component Value Date   WBC 7.4 07/22/2014   HGB 8.3* 07/22/2014   HCT 25.9* 07/22/2014   MCV 77.8* 07/22/2014   PLT 157 07/22/2014    --/--/O POS, O POS (06/09 2215)/RImmune  A/P    34 y.o. G2P2001 POD 1 s/p RCS Routine post op and postpartum care.   Awaiting void following foley catheter removal D/C tomorrow ABLA in setting of likely IDA.  hgb 9.9-->8.3.  Plan iron at d/c

## 2014-07-23 MED ORDER — OXYCODONE-ACETAMINOPHEN 5-325 MG PO TABS: 1.0000 | ORAL_TABLET | Freq: Four times a day (QID) | ORAL | Status: DC | PRN

## 2014-07-23 MED ORDER — IBUPROFEN 600 MG PO TABS: 600.0000 mg | ORAL_TABLET | Freq: Four times a day (QID) | ORAL | Status: DC | PRN

## 2014-07-23 NOTE — Lactation Note (Signed)
This note was copied from the chart of Chelsea Dustie Brittle. Lactation Consultation Note  Patient Name: Chelsea Lam APOLI'D Date: 07/23/2014 Reason for consult: Initial assessment    With this mom of a term baby, now 76 hours old. Mom is an experienced breast feeder, and is doing very well with breast feeding. Caryl Pina, RN, has been assisting her as needed. Mom and baby will be discharged to home today. Mom very interested in attending lactation support group and baby and me groups.  Maternal Data Formula Feeding for Exclusion: No Has patient been taught Hand Expression?: Yes Does the patient have breastfeeding experience prior to this delivery?: Yes  Feeding Feeding Type: Breast Fed Length of feed: 0 min  LATCH Score/Interventions Latch: Grasps breast easily, tongue down, lips flanged, rhythmical sucking.  Audible Swallowing: Spontaneous and intermittent  Type of Nipple: Everted at rest and after stimulation  Comfort (Breast/Nipple): Soft / non-tender (sstripe on right nipple from initial latch healing)     Hold (Positioning): No assistance needed to correctly position infant at breast.  LATCH Score: 10  Lactation Tools Discussed/Used     Consult Status Consult Status: Complete    Tonna Corner 07/23/2014, 8:38 AM

## 2014-07-23 NOTE — Discharge Instructions (Signed)

## 2014-07-23 NOTE — Progress Notes (Signed)
Patient is doing well.  She is tolerating PO, ambulating.  Pain is controlled.  Lochia is appropriate.  Voiding well.    Filed Vitals:   07/21/14 2341 07/22/14 0359 07/22/14 1704 07/23/14 0609  BP: 110/66 99/53 124/65 116/70  Pulse: 68 55 67 87  Temp: 97.7 F (36.5 C) 97.5 F (36.4 C) 97.7 F (36.5 C) 97.9 F (36.6 C)  TempSrc: Oral Oral Oral Oral  Resp: 18 16 16 18   Height:      Weight:      SpO2: 98% 97%  99%    NAD Abdomen:  soft, appropriate tenderness, incisions intact and without erythema, scant minimal old sanginous drainage. ext:    Symmetric, 1+ edema bilaterally, symmetric, no calf tenderness  Lab Results  Component Value Date   WBC 7.4 07/22/2014   HGB 8.3* 07/22/2014   HCT 25.9* 07/22/2014   MCV 77.8* 07/22/2014   PLT 157 07/22/2014    --/--/O POS, O POS (06/09 2215)/RImmune  A/P    34 y.o. G2P2001 POD #2 s/p RCS Routine post op and postpartum care.   Meeting all goals.  D/C today.  ABLA in setting of likely IDA.  hgb 9.9-->8.3.  Plan iron at d/c

## 2014-07-23 NOTE — Discharge Summary (Signed)
Obstetric Discharge Summary Reason for Admission: cesarean section Prenatal Procedures: none Intrapartum Procedures: cesarean: low cervical, transverse Postpartum Procedures: none Complications-Operative and Postpartum: none HEMOGLOBIN  Date Value Ref Range Status  07/22/2014 8.3* 12.0 - 15.0 g/dL Final   HCT  Date Value Ref Range Status  07/22/2014 25.9* 36.0 - 46.0 % Final    Physical Exam:  General: alert, cooperative and appears stated age 34: appropriate Uterine Fundus: firm Incision: healing well, no significant drainage DVT Evaluation: No evidence of DVT seen on physical exam. Negative Homan's sign.  Discharge Diagnoses: Term Pregnancy-delivered  Discharge Information: Date: 07/23/2014 Activity: pelvic rest Diet: routine Medications: PNV, Ibuprofen, Colace, Iron and Percocet Condition: stable Instructions: refer to practice specific booklet Discharge to: home Follow-up Information    Follow up with [facility] [name], MD In 10 days.   Specialty:  Obstetrics   Why:  For wound re-check   Contact information:   [address] [place] [id number] [phone]       Newborn Data: Live born female  Birth Weight: 8 lb 3.9 oz (3739 g) APGAR: 9, 9  Home with mother.  [place] 07/23/2014, 8:01 AM

## 2014-07-24 ENCOUNTER — Encounter (HOSPITAL_COMMUNITY): Payer: Self-pay | Admitting: Obstetrics

## 2016-06-25 ENCOUNTER — Encounter: Payer: Self-pay | Admitting: Gynecology

## 2017-07-10 ENCOUNTER — Ambulatory Visit: Payer: Self-pay | Admitting: Allergy

## 2018-08-12 ENCOUNTER — Ambulatory Visit
Admission: RE | Admit: 2018-08-12 | Discharge: 2018-08-12 | Disposition: A | Discharge status: Home / Self Care | Payer: 59 | Source: Ambulatory Visit | Attending: Obstetrics | Admitting: Obstetrics

## 2018-08-12 ENCOUNTER — Other Ambulatory Visit: Payer: Self-pay | Admitting: Obstetrics

## 2018-08-12 DIAGNOSIS — Z30431 Encounter for routine checking of intrauterine contraceptive device: Secondary | ICD-10-CM

## 2018-10-03 ENCOUNTER — Encounter (HOSPITAL_BASED_OUTPATIENT_CLINIC_OR_DEPARTMENT_OTHER): Payer: Self-pay | Admitting: Emergency Medicine

## 2018-10-03 ENCOUNTER — Emergency Department (HOSPITAL_BASED_OUTPATIENT_CLINIC_OR_DEPARTMENT_OTHER)
Admission: EM | Admit: 2018-10-03 | Discharge: 2018-10-03 | Disposition: A | Discharge status: Home / Self Care | Payer: 59 | Attending: Emergency Medicine | Admitting: Emergency Medicine

## 2018-10-03 ENCOUNTER — Emergency Department (HOSPITAL_BASED_OUTPATIENT_CLINIC_OR_DEPARTMENT_OTHER): Payer: 59

## 2018-10-03 ENCOUNTER — Other Ambulatory Visit: Payer: Self-pay

## 2018-10-03 DIAGNOSIS — D259 Leiomyoma of uterus, unspecified: Secondary | ICD-10-CM | POA: Insufficient documentation

## 2018-10-03 DIAGNOSIS — K824 Cholesterolosis of gallbladder: Secondary | ICD-10-CM | POA: Insufficient documentation

## 2018-10-03 DIAGNOSIS — Z79899 Other long term (current) drug therapy: Secondary | ICD-10-CM | POA: Diagnosis not present

## 2018-10-03 DIAGNOSIS — N939 Abnormal uterine and vaginal bleeding, unspecified: Secondary | ICD-10-CM | POA: Diagnosis present

## 2018-10-03 DIAGNOSIS — N938 Other specified abnormal uterine and vaginal bleeding: Secondary | ICD-10-CM | POA: Insufficient documentation

## 2018-10-03 DIAGNOSIS — Z87891 Personal history of nicotine dependence: Secondary | ICD-10-CM | POA: Diagnosis not present

## 2018-10-03 DIAGNOSIS — R1013 Epigastric pain: Secondary | ICD-10-CM

## 2018-10-03 LAB — COMPREHENSIVE METABOLIC PANEL
ALT: 29 U/L (ref 0–44)
AST: 21 U/L (ref 15–41)
Albumin: 3.7 g/dL (ref 3.5–5.0)
Alkaline Phosphatase: 59 U/L (ref 38–126)
Anion gap: 7 (ref 5–15)
BUN: 8 mg/dL (ref 6–20)
CO2: 24 mmol/L (ref 22–32)
Calcium: 8.6 mg/dL — ABNORMAL LOW (ref 8.9–10.3)
Chloride: 107 mmol/L (ref 98–111)
Creatinine, Ser: 0.73 mg/dL (ref 0.44–1.00)
GFR calc Af Amer: >60 mL/min (ref >60)
GFR calc non Af Amer: >60 mL/min (ref >60)
Glucose, Bld: 121 mg/dL — ABNORMAL HIGH (ref 70–99)
Potassium: 4 mmol/L (ref 3.5–5.1)
Sodium: 138 mmol/L (ref 135–145)
Total Bilirubin: 0.5 mg/dL (ref 0.3–1.2)
Total Protein: 6.6 g/dL (ref 6.5–8.1)

## 2018-10-03 LAB — CBC WITH DIFFERENTIAL/PLATELET
Abs Immature Granulocytes: 0.04 10*3/uL (ref 0.00–0.07)
Basophils Absolute: 0 10*3/uL (ref 0.0–0.1)
Basophils Relative: 1 %
Eosinophils Absolute: 0 10*3/uL (ref 0.0–0.5)
Eosinophils Relative: 1 %
HCT: 33.7 % — ABNORMAL LOW (ref 36.0–46.0)
Hemoglobin: 11.3 g/dL — ABNORMAL LOW (ref 12.0–15.0)
Immature Granulocytes: 1 %
Lymphocytes Relative: 31 %
Lymphs Abs: 1.8 10*3/uL (ref 0.7–4.0)
MCH: 31.6 pg (ref 26.0–34.0)
MCHC: 33.5 g/dL (ref 30.0–36.0)
MCV: 94.1 fL (ref 80.0–100.0)
Monocytes Absolute: 0.5 10*3/uL (ref 0.1–1.0)
Monocytes Relative: 8 %
Neutro Abs: 3.4 10*3/uL (ref 1.7–7.7)
Neutrophils Relative %: 58 %
Platelets: 234 10*3/uL (ref 150–400)
RBC: 3.58 MIL/uL — ABNORMAL LOW (ref 3.87–5.11)
RDW: 12 % (ref 11.5–15.5)
WBC: 5.8 10*3/uL (ref 4.0–10.5)
nRBC: 0 % (ref 0.0–0.2)

## 2018-10-03 LAB — PREGNANCY, URINE: Preg Test, Ur: NEGATIVE

## 2018-10-03 LAB — LIPASE, BLOOD: Lipase: 20 U/L (ref 11–51)

## 2018-10-03 MED ORDER — SODIUM CHLORIDE 0.9 % IV BOLUS
1000.0000 mL | Freq: Once | INTRAVENOUS | Status: AC
  Administered 2018-10-03: 1000 mL via INTRAVENOUS

## 2018-10-03 MED ORDER — OXYCODONE-ACETAMINOPHEN 5-325 MG PO TABS
1.0000 | ORAL_TABLET | Freq: Once | ORAL | Status: AC
  Administered 2018-10-03: 1 via ORAL
  Filled 2018-10-03: qty 1

## 2018-10-03 MED ORDER — ONDANSETRON HCL 4 MG/2ML IJ SOLN
4.0000 mg | Freq: Once | INTRAMUSCULAR | Status: AC
  Administered 2018-10-03: 4 mg via INTRAVENOUS
  Filled 2018-10-03: qty 2

## 2018-10-03 MED ORDER — MEGESTROL ACETATE 40 MG PO TABS: ORAL_TABLET | ORAL | 0 refills | Status: AC

## 2018-10-03 MED ORDER — MEGESTROL ACETATE 40 MG PO TABS
40.0000 mg | ORAL_TABLET | Freq: Every day | ORAL | Status: DC
  Filled 2018-10-03: qty 1

## 2018-10-03 MED ORDER — FENTANYL CITRATE (PF) 100 MCG/2ML IJ SOLN
50.0000 ug | Freq: Once | INTRAMUSCULAR | Status: AC
  Administered 2018-10-03: 50 ug via INTRAVENOUS
  Filled 2018-10-03: qty 2

## 2018-10-03 NOTE — ED Triage Notes (Signed)
Vag bleeding since Wed, has a uterine fibroid and is taking BC, to help regulate . Bleeding has increased over the past 24hrs. Using 1 pad per hr, having abd pain as well

## 2018-10-03 NOTE — ED Provider Notes (Signed)
[facility] EMERGENCY DEPARTMENT Provider Note   CSN: [id number] Arrival date & time: 10/03/18  0919     History   Chief Complaint Chief Complaint  Patient presents with  . Vaginal Bleeding    HPI Chelsea Lam is a 38 y.o. female.     HPI  38 year old female comes in a chief complaint of vaginal bleeding and abdominal pain.  She has diagnosis of ovarian cyst and fibroid.  Patient has been struggling with dysfunctional vaginal bleeding for the last 2 months.  Patient has been placed on birth control and there is plan for elective surgery if her symptoms do not get better.  She reports that over the past 4 to 5 days her symptoms have progressed dramatically.  She is changing clots every 30 minutes and they are soaked in blood.  She is also passing clots.  She is having cramping pelvic pain which is not new, but she does also have upper quadrant abdominal pain which started yesterday.  That pain is worse with p.o. intake and has associated nausea.  She has history of appendectomy and C-sections.  No known history of gallstones.  Patient had some dizziness when she got up this morning and also feels weaker than usual.  She is not on any aspirin, Plavix or blood thinners.  Past Medical History:  Diagnosis Date  . Fibroids   . Ovarian cyst     Patient Active Problem List   Diagnosis Date Noted  . Status post repeat low transverse cesarean section 07/21/2014  . Encounter for trial of labor 07/20/2014    Past Surgical History:  Procedure Laterality Date  . APPENDECTOMY    . CESAREAN SECTION  2008  . CESAREAN SECTION N/A 07/21/2014   Procedure: CESAREAN SECTION;  Surgeon: Jerelyn Charles, MD;  Location: [facility];  Service: Obstetrics;  Laterality: N/A;  . DILATION AND EVACUATION N/A 06/17/2013   Procedure: DILATATION AND EVACUATION;  Surgeon: Terrance Mass, MD;  Location: [facility];  Service: Gynecology;  Laterality: N/A;  . HYSTEROSCOPY WITH RESECTOSCOPE N/A 06/17/2013   Procedure: HYSTEROSCOPY WITH RESECTOSCOPE;  Surgeon: Terrance Mass, MD;  Location: [facility];  Service: Gynecology;  Laterality: N/A;  RESECTOSCOPIC MYOMECTOMY WITH MYOSURE-REP TO BE PRESENT.    . INTRAUTERINE DEVICE INSERTION  12/2009   mirena  . KNEE RECONSTRUCTION, MEDIAL PATELLAR FEMORAL LIGAMENT  1999     OB History    Gravida  2   Para  2   Term  2   Preterm      AB      Living  1     SAB      TAB      Ectopic      Multiple  0   Live Births  1            Home Medications    Prior to Admission medications   Medication Sig Start Date End Date Taking? Authorizing Provider  JUNEL 1.5/30 1.5-30 MG-MCG tablet TAKE 1 TABLET BY MOUTH DAILY X 21D, IMMEDIATELY START NEW PACK 07/30/18  Yes [provider]  famotidine (PEPCID) 20 MG tablet Take 20 mg by mouth 2 (two) times daily.    [provider]  ibuprofen (ADVIL,MOTRIN) 600 MG tablet Take 1 tablet (600 mg total) by mouth every 6 (six) hours as needed for moderate pain or cramping. 07/23/14   Jerelyn Charles, MD  megestrol (MEGACE) 40 MG tablet Take 1 tablet (40 mg total) by mouth 3 (three)  times daily for 2 days, THEN 1 tablet (40 mg total) 2 (two) times daily for 3 days, THEN 1 tablet (40 mg total) daily for 3 days. 10/03/18 10/11/18  Varney Biles, MD  oxyCODONE-acetaminophen (PERCOCET/ROXICET) 5-325 MG per tablet Take 1-2 tablets by mouth every 6 (six) hours as needed for severe pain. 07/23/14   Jerelyn Charles, MD  Prenatal Vit-Fe Fumarate-FA (PRENATAL MULTIVITAMIN) TABS tablet Take 2 tablets by mouth daily.     [provider]    Family History Family History  Problem Relation Age of Onset  . Hypertension Mother     Social History Social History   Tobacco Use  . Smoking status: Former Smoker    Types: Cigarettes    Quit date: 11/02/2013    Years since quitting: 4.9  . Smokeless tobacco: Never Used  Substance Use Topics  . Alcohol use: Yes    Comment: social  . Drug use: No      Allergies   Tetracyclines & related   Review of Systems Review of Systems  Constitutional: Positive for activity change.  Respiratory: Negative for shortness of breath.   Cardiovascular: Negative for chest pain.  Gastrointestinal: Positive for abdominal pain. Negative for nausea and vomiting.  Genitourinary: Positive for vaginal bleeding.  Neurological: Positive for dizziness.  Hematological: Does not bruise/bleed easily.  All other systems reviewed and are negative.    Physical Exam Updated Vital Signs BP 112/71   Pulse 82   Temp 98.9 F (37.2 C) (Oral)   Resp 16   Ht 5\' 2"  (1.575 m)   Wt 90.7 kg   SpO2 100%   BMI 36.58 kg/m   Physical Exam Vitals signs and nursing note reviewed.  Constitutional:      Appearance: She is well-developed.  HENT:     Head: Normocephalic and atraumatic.  Eyes:     Pupils: Pupils are equal, round, and reactive to light.  Neck:     Musculoskeletal: Neck supple.  Cardiovascular:     Rate and Rhythm: Normal rate and regular rhythm.     Heart sounds: Normal heart sounds. No murmur.  Pulmonary:     Effort: Pulmonary effort is normal. No respiratory distress.  Abdominal:     General: There is no distension.     Palpations: Abdomen is soft.     Tenderness: There is abdominal tenderness. There is guarding. There is no rebound.  Skin:    General: Skin is warm and dry.  Neurological:     Mental Status: She is alert and oriented to person, place, and time.      ED Treatments / Results  Labs (all labs ordered are listed, but only abnormal results are displayed) Labs Reviewed  CBC WITH DIFFERENTIAL/PLATELET - Abnormal; Notable for the following components:      Result Value   RBC 3.58 (*)    Hemoglobin 11.3 (*)    HCT 33.7 (*)    All other components within normal limits  COMPREHENSIVE METABOLIC PANEL - Abnormal; Notable for the following components:   Glucose, Bld 121 (*)    Calcium 8.6 (*)    All other components within normal  limits  LIPASE, BLOOD  PREGNANCY, URINE    EKG None  Radiology US Pelvis Transvaginal Non-ob (tv Only)  Result Date: 10/03/2018 CLINICAL DATA:  Chronic vaginal bleeding for several months. Worse over the last 3 days EXAM: TRANSABDOMINAL AND TRANSVAGINAL ULTRASOUND OF PELVIS TECHNIQUE: Both transabdominal and transvaginal ultrasound examinations of the pelvis were performed. Transabdominal technique  was performed for global imaging of the pelvis including uterus, ovaries, adnexal regions, and pelvic cul-de-sac. It was necessary to proceed with endovaginal exam following the transabdominal exam to visualize the endometrium and ovaries. COMPARISON:  05/29/2013 FINDINGS: Uterus Measurements: 11.1 x 5 x 6.4 cm = volume: 184 mL. Multiple uterine hypoechoic masses consistent with fibroids. 9 x 8 x 9 mm submucosal anterior fundal fibroid. 1 x 0.8 x 1 cm submucosal anterior fundal fibroid. 1.8 x 1.6 x 2 cm submucosal fibroid extending into the endometrium. Endometrium Thickness: 10 mm.  No focal abnormality visualized. Right ovary Measurements: 2.9 x 2.2 x 1.6 cm = volume: 5.1 mL. Normal appearance/no adnexal mass. Left ovary Measurements: 2.3 x 1.6 x 1.5 cm = volume: 3 mL. Normal appearance/no adnexal mass. Other findings Complex fluid in the endometrial cavity and cervix consistent with blood products. Small amount of pelvic free fluid likely physiologic. IMPRESSION: 1. No ovarian torsion. 2. Multiple uterine fibroids with the largest measuring 1.8 x 1.6 x 2 cm extending into the endometrium. Complex fluid in the endometrial cavity and cervix consistent with blood products which passes during the examination. Gyn consultation is recommended. Electronically Signed   By: Kathreen Devoid   On: 10/03/2018 12:28   US Pelvis (transabdominal Only)  Result Date: 10/03/2018 CLINICAL DATA:  Chronic vaginal bleeding for several months. Worse over the last 3 days EXAM: TRANSABDOMINAL AND TRANSVAGINAL ULTRASOUND OF PELVIS  TECHNIQUE: Both transabdominal and transvaginal ultrasound examinations of the pelvis were performed. Transabdominal technique was performed for global imaging of the pelvis including uterus, ovaries, adnexal regions, and pelvic cul-de-sac. It was necessary to proceed with endovaginal exam following the transabdominal exam to visualize the endometrium and ovaries. COMPARISON:  05/29/2013 FINDINGS: Uterus Measurements: 11.1 x 5 x 6.4 cm = volume: 184 mL. Multiple uterine hypoechoic masses consistent with fibroids. 9 x 8 x 9 mm submucosal anterior fundal fibroid. 1 x 0.8 x 1 cm submucosal anterior fundal fibroid. 1.8 x 1.6 x 2 cm submucosal fibroid extending into the endometrium. Endometrium Thickness: 10 mm.  No focal abnormality visualized. Right ovary Measurements: 2.9 x 2.2 x 1.6 cm = volume: 5.1 mL. Normal appearance/no adnexal mass. Left ovary Measurements: 2.3 x 1.6 x 1.5 cm = volume: 3 mL. Normal appearance/no adnexal mass. Other findings Complex fluid in the endometrial cavity and cervix consistent with blood products. Small amount of pelvic free fluid likely physiologic. IMPRESSION: 1. No ovarian torsion. 2. Multiple uterine fibroids with the largest measuring 1.8 x 1.6 x 2 cm extending into the endometrium. Complex fluid in the endometrial cavity and cervix consistent with blood products which passes during the examination. Gyn consultation is recommended. Electronically Signed   By: Kathreen Devoid   On: 10/03/2018 12:28   US Abdomen Limited Ruq  Result Date: 10/03/2018 CLINICAL DATA:  Epigastric pain for 2-3 days EXAM: ULTRASOUND ABDOMEN LIMITED RIGHT UPPER QUADRANT COMPARISON:  None. FINDINGS: Gallbladder: No gallstones or wall thickening visualized. 7 mm nonmobile echogenic nodular area along the gallbladder wall likely reflecting a gallbladder polyp. No sonographic Murphy sign noted by sonographer. Common bile duct: Diameter: 3 mm Liver: No focal lesion identified. Increased hepatic parenchymal  echogenicity. Portal vein is patent on color Doppler imaging with normal direction of blood flow towards the liver. Other: None. IMPRESSION: 1. No cholelithiasis or sonographic evidence of acute cholecystitis. 2. 7 mm gallbladder polyp. 3. Increased hepatic echogenicity as can be seen with hepatic steatosis. Electronically Signed   By: Kathreen Devoid   On: 10/03/2018  12:23    Procedures Procedures (including critical care time)  Medications Ordered in ED Medications  megestrol (MEGACE) tablet 40 mg (40 mg Oral Not Given 10/03/18 1430)  fentaNYL (SUBLIMAZE) injection 50 mcg (50 mcg Intravenous Given 10/03/18 1041)  ondansetron (ZOFRAN) injection 4 mg (4 mg Intravenous Given 10/03/18 1041)  sodium chloride 0.9 % bolus 1,000 mL (0 mLs Intravenous Stopped 10/03/18 1312)  oxyCODONE-acetaminophen (PERCOCET/ROXICET) 5-325 MG per tablet 1 tablet (1 tablet Oral Given 10/03/18 1346)     Initial Impression / Assessment and Plan / ED Course  I have reviewed the triage vital signs and the nursing notes.  Pertinent labs & imaging results that were available during my care of the patient were reviewed by me and considered in my medical decision making (see chart for details).        38 year old female comes in a chief complaint of vaginal bleeding, pelvic pain and abdominal pain.  It appears that her vaginal bleeding is gotten worse over the past few days despite her taking birth control pills.  It is possible to that she might have severe anemia given that she is having some dizziness and weakness.  Ultrasound pelvis has been ordered.  Additionally she is having epigastric and right upper quadrant tenderness on her exam.  Her symptoms were worse after food intake yesterday, therefore gallstones are also in the differential diagnosis.  Right upper quadrant ultrasound has been ordered.  3:16 PM Patient is noted to have gallbladder polyps along with multiple uterine fibroids.  I spoke with Dr. Rogue Bussing, who  wants patient to be on a Megace taper along with close follow-up. For the gallbladder polyp, we have advised her to follow-up with general surgery.  The patient appears reasonably screened and/or stabilized for discharge and I doubt any other medical condition or other [facility] requiring further screening, evaluation, or treatment in the ED at this time prior to discharge.   Results from the ER workup discussed with the patient face to face and all questions answered to the best of my ability. The patient is safe for discharge with strict return precautions.   Final Clinical Impressions(s) / ED Diagnoses   Final diagnoses:  DUB (dysfunctional uterine bleeding)  Uterine leiomyoma, unspecified location  Gallbladder polyp    ED Discharge Orders         Ordered    megestrol (MEGACE) 40 MG tablet     10/03/18 [place], Serigne Kubicek, MD 10/03/18 1517

## 2018-10-03 NOTE — ED Notes (Signed)
Pt requesting pain medication prescription. Pt informed that EDp to be notified.

## 2018-10-03 NOTE — ED Notes (Signed)
Patient transported to Ultrasound 

## 2018-10-03 NOTE — Discharge Instructions (Signed)
You are seen in the ER for abdominal pain and worsening vaginal bleeding.  We discussed the case with your gynecologist after the ultrasound results came back.  They want you to discontinue your current oral contraceptive and start taking Megace instead as prescribed.  They would like you to contact the clinic for an urgent follow-up.  Additionally, we noted that he also had gallbladder polyps.  General surgery would like you to be seen in the clinic for further evaluation of that.

## 2018-10-03 NOTE — ED Notes (Signed)
ED Provider at bedside. 

## 2020-07-02 ENCOUNTER — Ambulatory Visit: Payer: Self-pay | Attending: Internal Medicine

## 2020-07-02 DIAGNOSIS — Z20822 Contact with and (suspected) exposure to covid-19: Secondary | ICD-10-CM | POA: Insufficient documentation

## 2020-07-03 LAB — NOVEL CORONAVIRUS, NAA: SARS-CoV-2, NAA: NOT DETECTED

## 2020-07-03 LAB — SARS-COV-2, NAA 2 DAY TAT

## 2020-08-27 ENCOUNTER — Ambulatory Visit: Payer: Self-pay | Attending: Internal Medicine

## 2020-08-27 DIAGNOSIS — Z20822 Contact with and (suspected) exposure to covid-19: Secondary | ICD-10-CM | POA: Insufficient documentation

## 2020-08-28 LAB — SARS-COV-2, NAA 2 DAY TAT

## 2020-08-28 LAB — NOVEL CORONAVIRUS, NAA: SARS-CoV-2, NAA: DETECTED — AB

## 2021-06-19 IMAGING — US ULTRASOUND ABDOMEN LIMITED
1 series · 14 of 25 positions shown · non-contrast
Comparison: None.

CLINICAL DATA: Epigastric pain for 2-3 days

EXAM:
ULTRASOUND ABDOMEN LIMITED RIGHT UPPER QUADRANT

[Series 1: ultrasound abdomen limited · 14 of 34 slices shown]
[im 1/34]
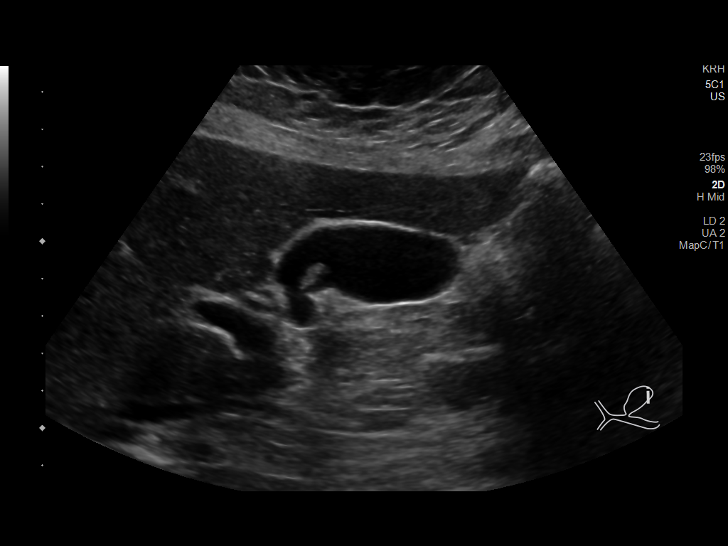
[im 3/34]
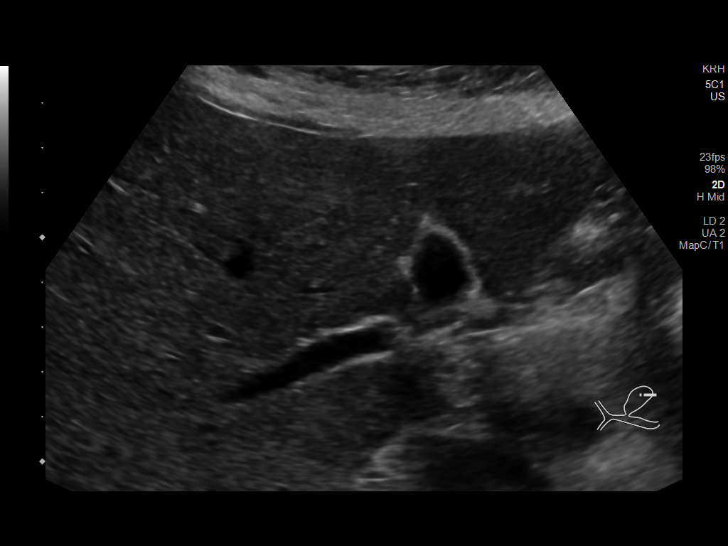
[im 6/34]
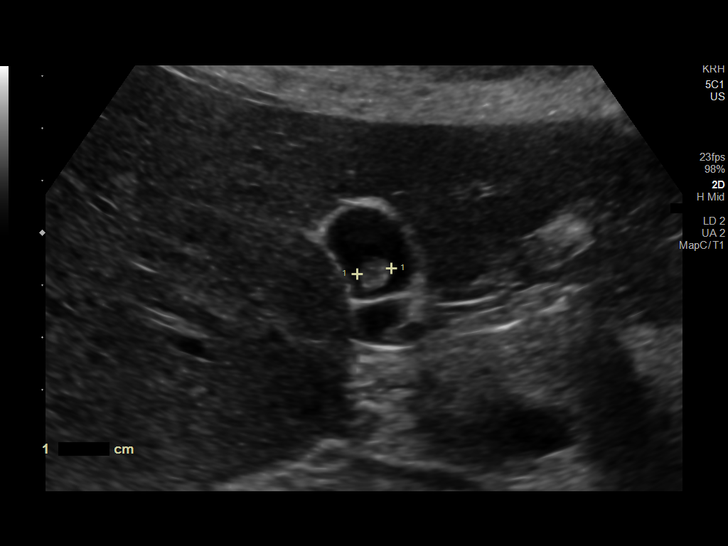
[im 9/34]
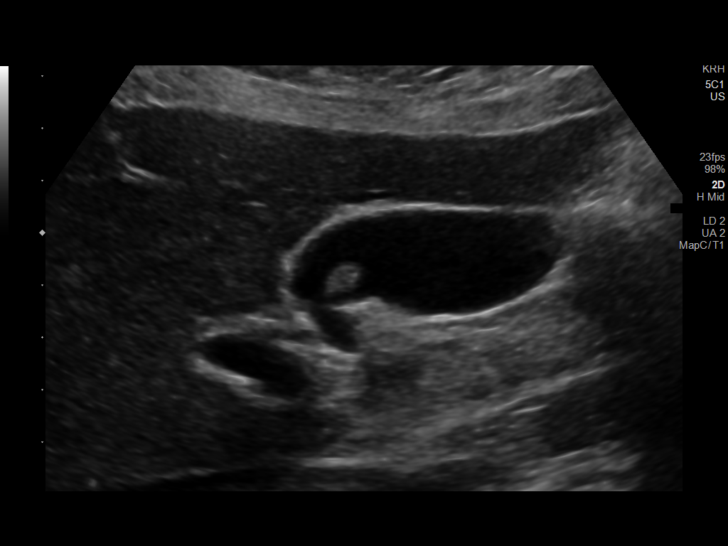
[im 12/34]
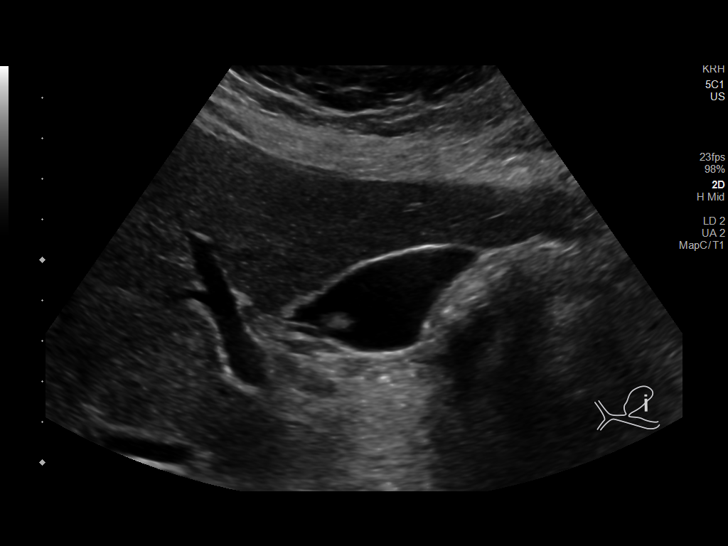
[im 13/34]
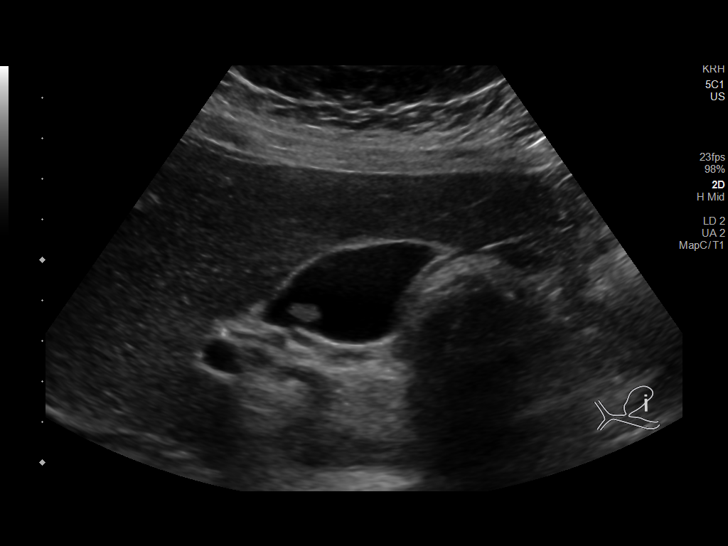
[im 16/34]
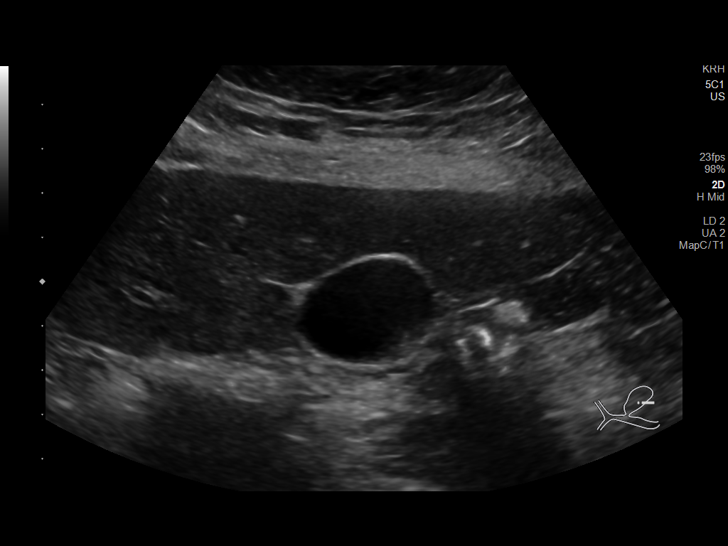
[im 18/34]
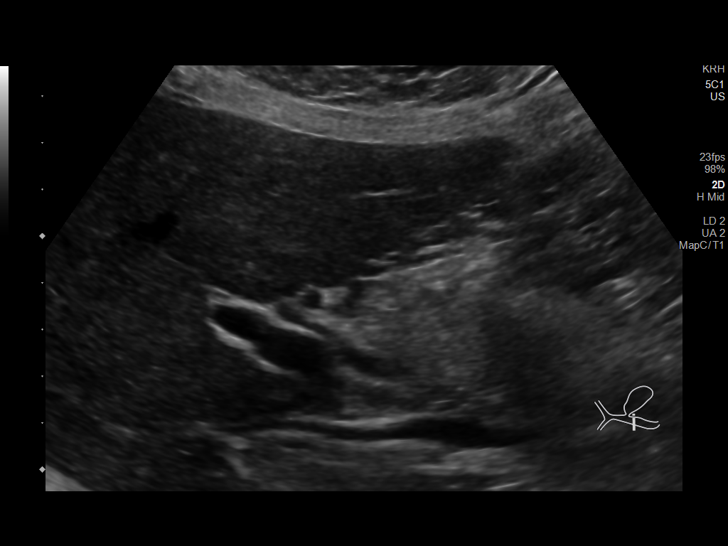
[im 21/34]
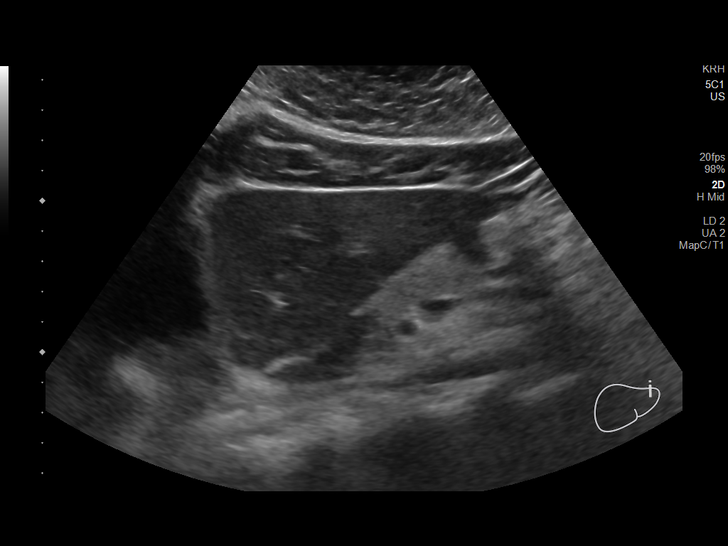
[im 23/34]
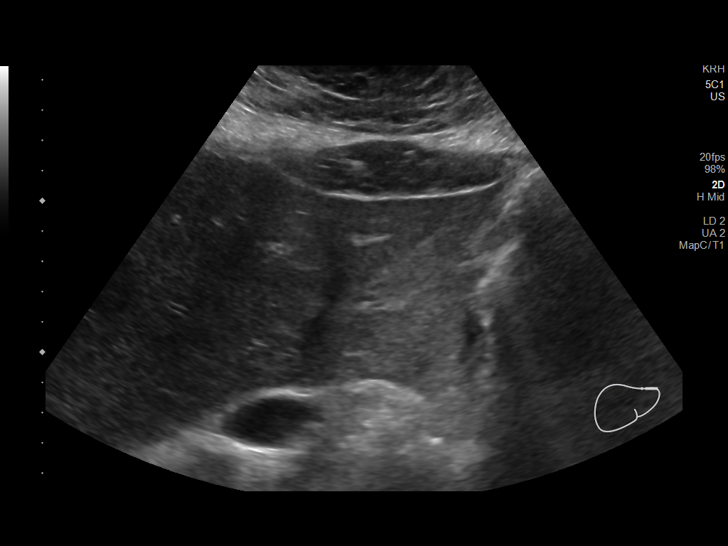
[im 25/34]
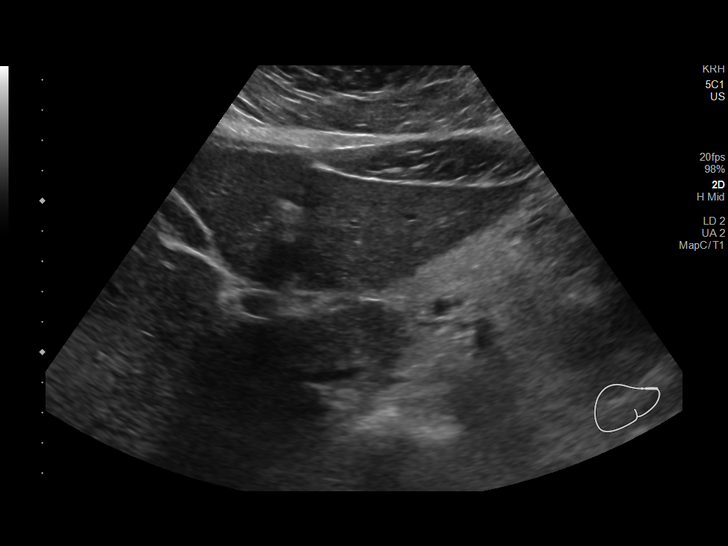
[im 28/34]
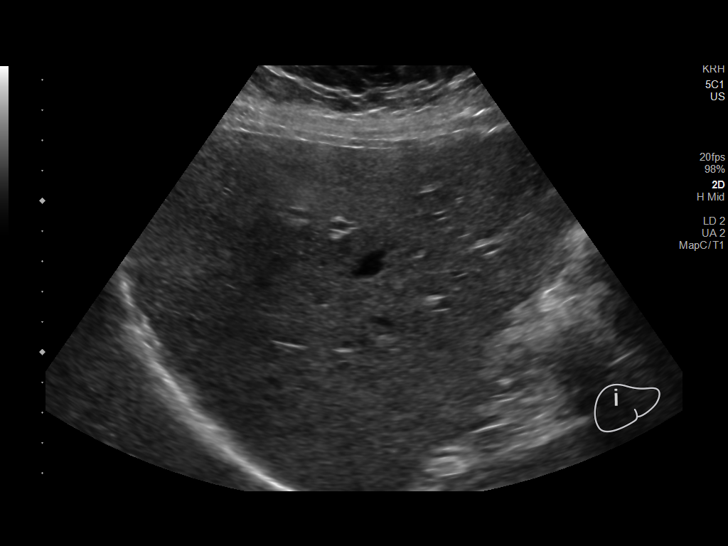
[im 31/34]
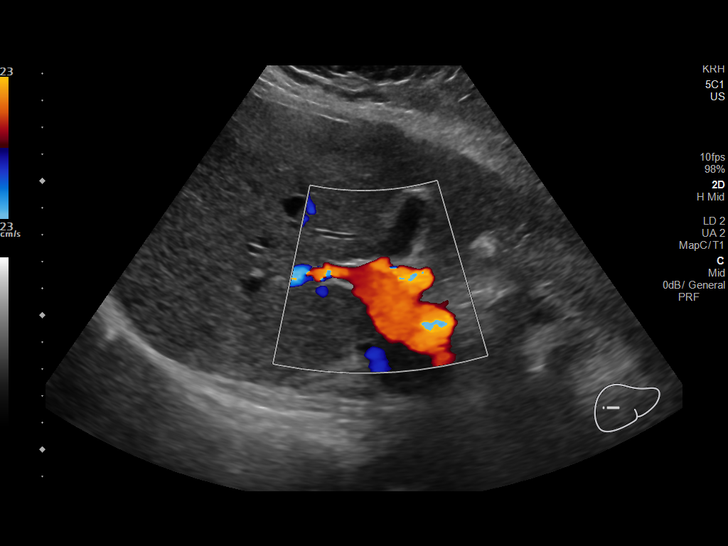
[im 34/34]
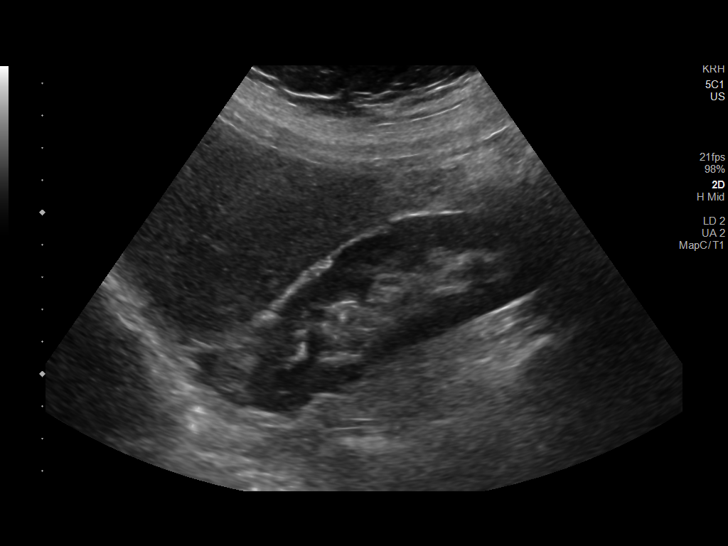

[14 of 25 positions shown; findings below may reference images not displayed]

FINDINGS: Gallbladder:

No gallstones or wall thickening visualized. 7 mm nonmobile
echogenic nodular area along the gallbladder wall likely reflecting
a gallbladder polyp. No sonographic Murphy sign noted by
sonographer.

Common bile duct:

Diameter: 3 mm

Liver:

No focal lesion identified. Increased hepatic parenchymal
echogenicity. Portal vein is patent on color Doppler imaging with
normal direction of blood flow towards the liver.

Other: None.
IMPRESSION: 1. No cholelithiasis or sonographic evidence of acute cholecystitis.
2. 7 mm gallbladder polyp.
3. Increased hepatic echogenicity as can be seen with hepatic
steatosis.

## 2021-06-19 IMAGING — US US PELVIS COMPLETE
1 series · 13 of 25 positions shown · non-contrast
Comparison: 05/29/2013

CLINICAL DATA: Chronic vaginal bleeding for several months. Worse
over the last 3 days

EXAM:
TRANSABDOMINAL AND TRANSVAGINAL ULTRASOUND OF PELVIS
TECHNIQUE: Both transabdominal and transvaginal ultrasound examinations of the
pelvis were performed. Transabdominal technique was performed for
global imaging of the pelvis including uterus, ovaries, adnexal
regions, and pelvic cul-de-sac. It was necessary to proceed with
endovaginal exam following the transabdominal exam to visualize the
endometrium and ovaries.

[Series 1: us pelvis complete · 83 acquisitions, 13 frames shown]
[im 1/83]
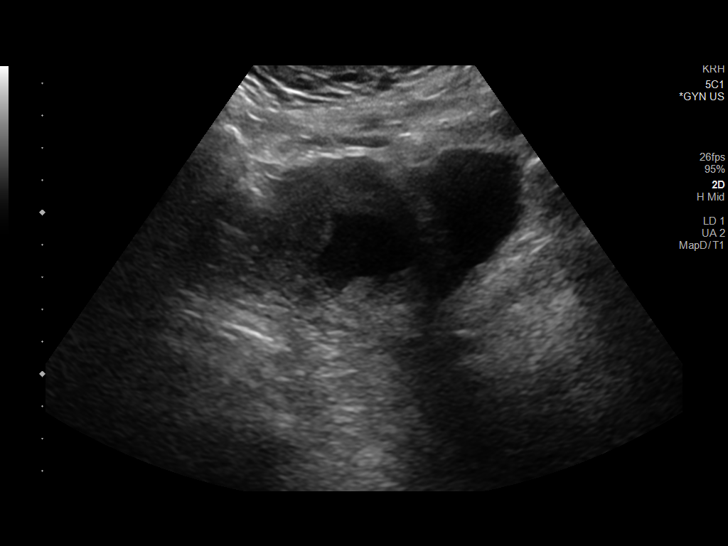
[im 7/83]
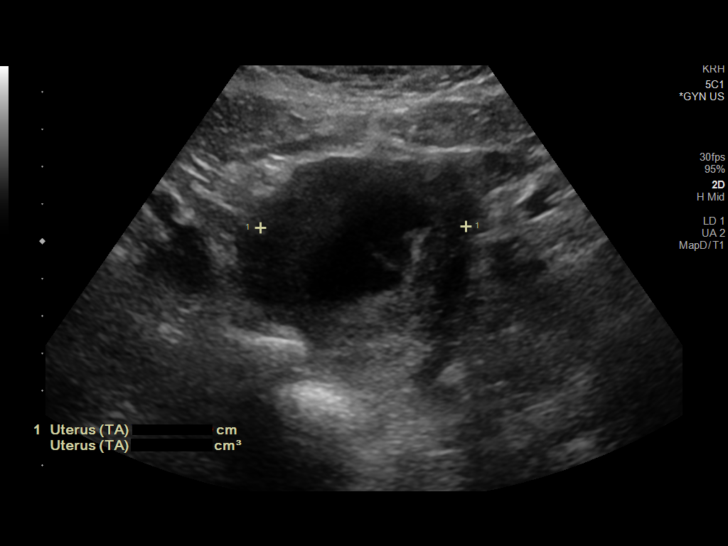
[im 14/83]
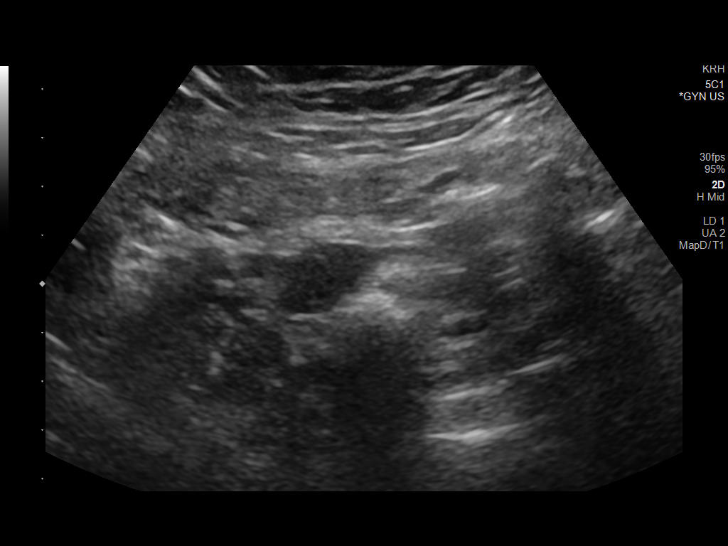
[im 21/83]
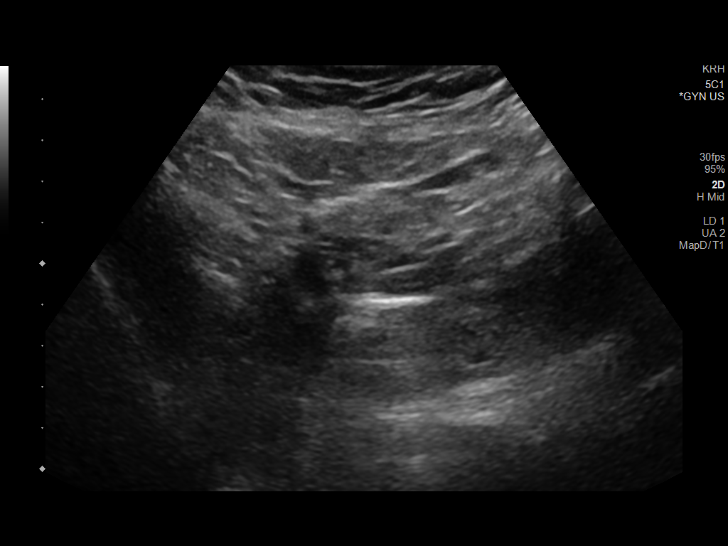
[im 28/83]
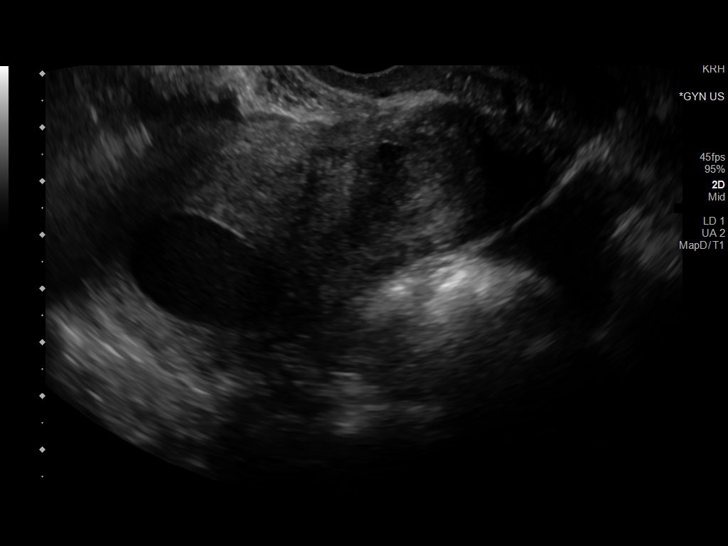
[im 35/83]
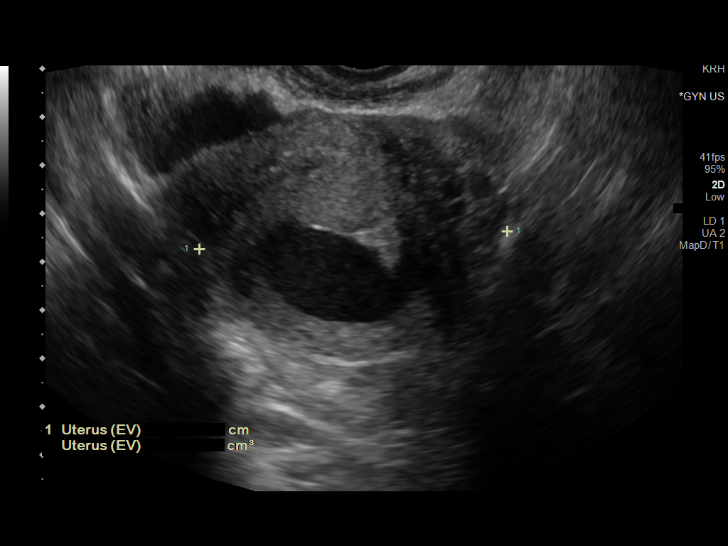
[im 42/83]
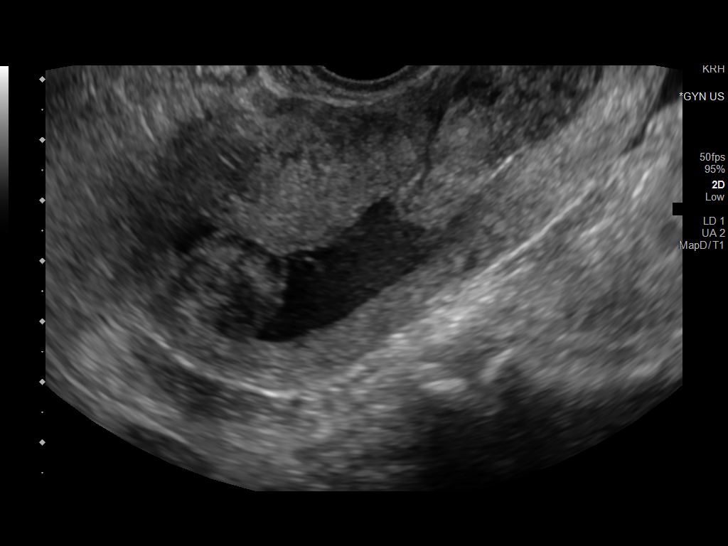
[im 48/83]
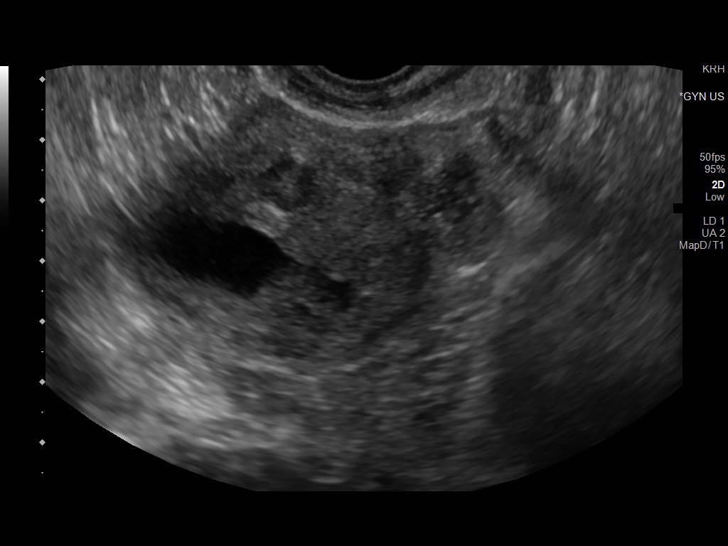
[im 55/83]
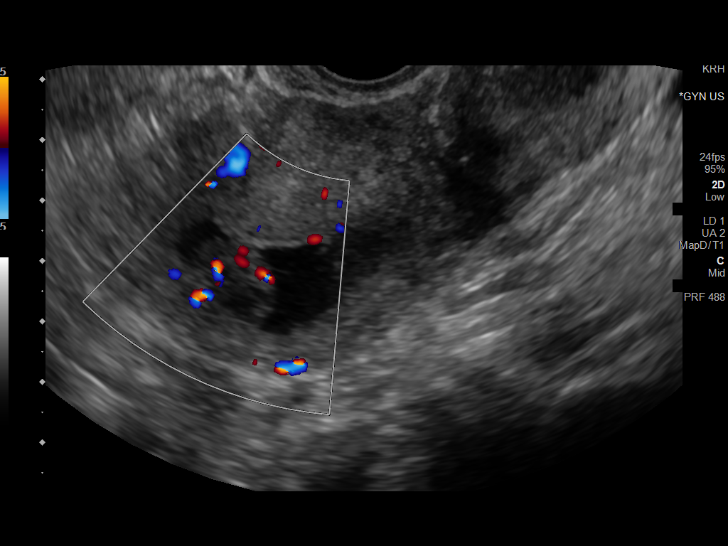
[im 62/83]
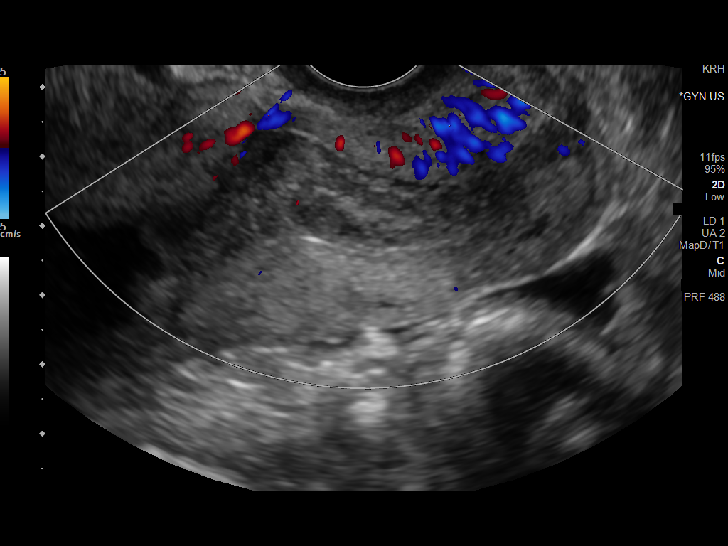
[im 69/83]
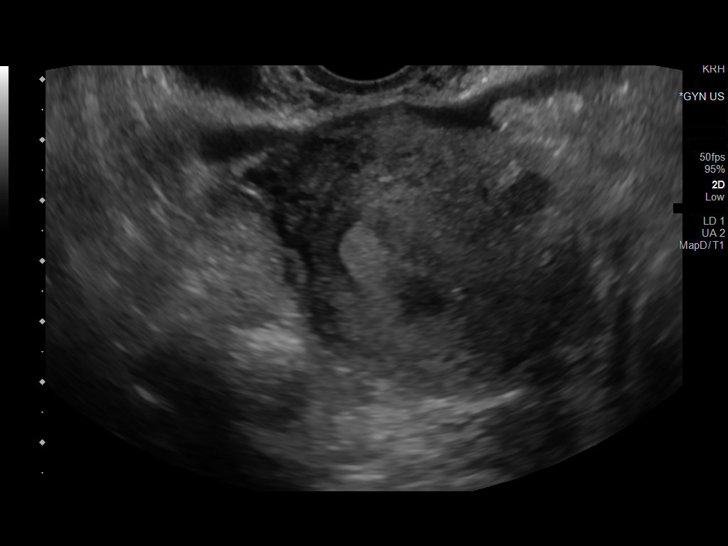
[im 76/83]
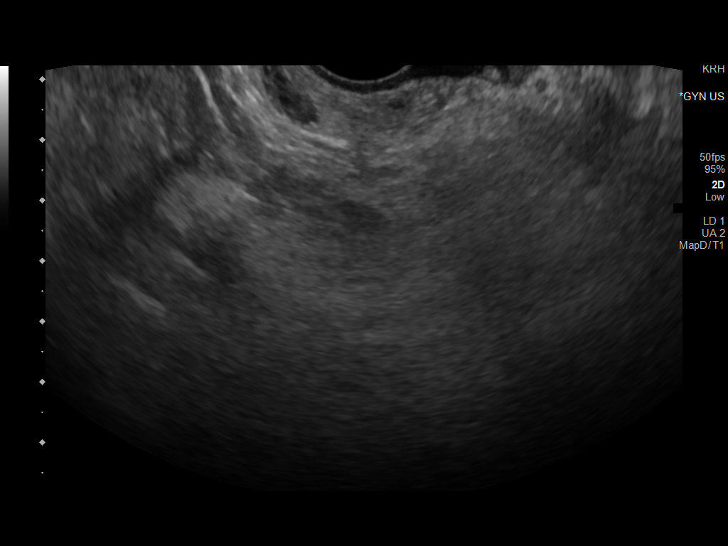
[im 83/83]
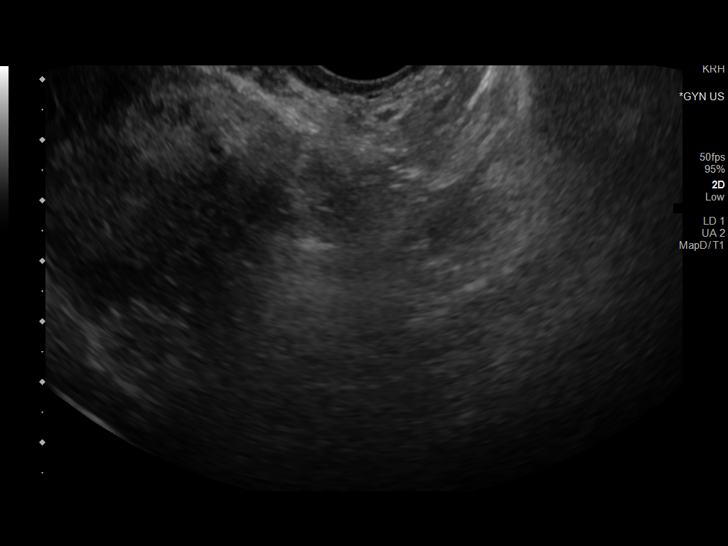

[13 of 25 positions shown; findings below may reference images not displayed]

FINDINGS: Uterus

Measurements: 11.1 x 5 x 6.4 cm = volume: 184 mL. Multiple uterine
hypoechoic masses consistent with fibroids. 9 x 8 x 9 mm submucosal
anterior fundal fibroid. 1 x 0.8 x 1 cm submucosal anterior fundal
fibroid. 1.8 x 1.6 x 2 cm submucosal fibroid extending into the
endometrium.

Endometrium

Thickness: 10 mm.  No focal abnormality visualized.

Right ovary

Measurements: 2.9 x 2.2 x 1.6 cm = volume: 5.1 mL. Normal
appearance/no adnexal mass.

Left ovary

Measurements: 2.3 x 1.6 x 1.5 cm = volume: 3 mL. Normal
appearance/no adnexal mass.

Other findings

Complex fluid in the endometrial cavity and cervix consistent with
blood products. Small amount of pelvic free fluid likely
physiologic.
IMPRESSION: 1. No ovarian torsion.
2. Multiple uterine fibroids with the largest measuring 1.8 x 1.6 x
2 cm extending into the endometrium. Complex fluid in the
endometrial cavity and cervix consistent with blood products which
passes during the examination. Gyn consultation is recommended.

## 2023-03-31 ENCOUNTER — Emergency Department (HOSPITAL_BASED_OUTPATIENT_CLINIC_OR_DEPARTMENT_OTHER)
Admission: EM | Admit: 2023-03-31 | Discharge: 2023-03-31 | Disposition: A | Discharge status: Home / Self Care | Payer: 59 | Attending: Emergency Medicine | Admitting: Emergency Medicine

## 2023-03-31 ENCOUNTER — Other Ambulatory Visit: Payer: Self-pay

## 2023-03-31 ENCOUNTER — Encounter (HOSPITAL_BASED_OUTPATIENT_CLINIC_OR_DEPARTMENT_OTHER): Payer: Self-pay | Admitting: *Deleted

## 2023-03-31 ENCOUNTER — Emergency Department (HOSPITAL_BASED_OUTPATIENT_CLINIC_OR_DEPARTMENT_OTHER): Payer: 59

## 2023-03-31 DIAGNOSIS — K8020 Calculus of gallbladder without cholecystitis without obstruction: Secondary | ICD-10-CM | POA: Insufficient documentation

## 2023-03-31 DIAGNOSIS — R109 Unspecified abdominal pain: Secondary | ICD-10-CM | POA: Diagnosis present

## 2023-03-31 LAB — CBC
HCT: 42.1 % (ref 36.0–46.0)
Hemoglobin: 14.7 g/dL (ref 12.0–15.0)
MCH: 33.6 pg (ref 26.0–34.0)
MCHC: 34.9 g/dL (ref 30.0–36.0)
MCV: 96.1 fL (ref 80.0–100.0)
Platelets: 283 10*3/uL (ref 150–400)
RBC: 4.38 MIL/uL (ref 3.87–5.11)
RDW: 12.4 % (ref 11.5–15.5)
WBC: 9.9 10*3/uL (ref 4.0–10.5)
nRBC: 0 % (ref 0.0–0.2)

## 2023-03-31 LAB — URINALYSIS, ROUTINE W REFLEX MICROSCOPIC
Bilirubin Urine: NEGATIVE
Glucose, UA: NEGATIVE mg/dL
Ketones, ur: NEGATIVE mg/dL
Leukocytes,Ua: NEGATIVE
Nitrite: NEGATIVE
Protein, ur: NEGATIVE mg/dL
Specific Gravity, Urine: 1.025 (ref 1.005–1.030)
pH: 6 (ref 5.0–8.0)

## 2023-03-31 LAB — COMPREHENSIVE METABOLIC PANEL
ALT: 48 U/L — ABNORMAL HIGH (ref 0–44)
AST: 29 U/L (ref 15–41)
Albumin: 4.2 g/dL (ref 3.5–5.0)
Alkaline Phosphatase: 78 U/L (ref 38–126)
Anion gap: 6 (ref 5–15)
BUN: 9 mg/dL (ref 6–20)
CO2: 27 mmol/L (ref 22–32)
Calcium: 9.1 mg/dL (ref 8.9–10.3)
Chloride: 100 mmol/L (ref 98–111)
Creatinine, Ser: 0.64 mg/dL (ref 0.44–1.00)
GFR, Estimated: >60 mL/min (ref >60)
Glucose, Bld: 135 mg/dL — ABNORMAL HIGH (ref 70–99)
Potassium: 3.2 mmol/L — ABNORMAL LOW (ref 3.5–5.1)
Sodium: 133 mmol/L — ABNORMAL LOW (ref 135–145)
Total Bilirubin: 0.6 mg/dL (ref 0.0–1.2)
Total Protein: 7.1 g/dL (ref 6.5–8.1)

## 2023-03-31 LAB — URINALYSIS, MICROSCOPIC (REFLEX)

## 2023-03-31 LAB — LIPASE, BLOOD: Lipase: 24 U/L (ref 11–51)

## 2023-03-31 LAB — MAGNESIUM: Magnesium: 2.1 mg/dL (ref 1.7–2.4)

## 2023-03-31 LAB — PREGNANCY, URINE: Preg Test, Ur: NEGATIVE

## 2023-03-31 MED ORDER — ONDANSETRON 4 MG PO TBDP: 4.0000 mg | ORAL_TABLET | Freq: Three times a day (TID) | ORAL | 0 refills | Status: AC | PRN

## 2023-03-31 MED ORDER — OXYCODONE HCL 5 MG PO TABS: 5.0000 mg | ORAL_TABLET | Freq: Four times a day (QID) | ORAL | 0 refills | Status: DC | PRN

## 2023-03-31 MED ORDER — OXYCODONE-ACETAMINOPHEN 5-325 MG PO TABS
1.0000 | ORAL_TABLET | ORAL | Status: DC | PRN
  Administered 2023-03-31: 1 via ORAL
  Filled 2023-03-31: qty 1

## 2023-03-31 MED ORDER — ONDANSETRON 4 MG PO TBDP
4.0000 mg | ORAL_TABLET | Freq: Once | ORAL | Status: AC | PRN
  Administered 2023-03-31: 4 mg via ORAL
  Filled 2023-03-31: qty 1

## 2023-03-31 NOTE — ED Notes (Signed)
PO successful

## 2023-03-31 NOTE — ED Triage Notes (Signed)
 Pt states that she thinks she is having a gallbladder attack.  Pt is having right upper abdominal pain with radiation to her back, pain began last pm and became severe (stabbing pain) since eating lunch. Pt has had nausea and vomiting with this.

## 2023-03-31 NOTE — ED Provider Notes (Signed)
 [place] [facility] AT [facility] [place] Provider Note   CSN: [id number] Arrival date & time: 03/31/23  1519     History  Chief Complaint  Patient presents with   Abdominal Pain    Chelsea Lam is a 43 y.o. female.   Abdominal Pain   43 year old female presents to the emergency department with complaints of abdominal pain.  Patient reports pain in her upper right abdomen.  States the pain initially began yesterday evening after eating dinner.  States the symptoms seem to resolve when she went to bed before returning after eating lunch today.  Patient reports some radiation between her scapula.  Reports nausea with emesis after lunch.  Denies any chest pain, shortness of breath, urinary symptoms, change in bowel habits, fever, chills, night sweats.  Past medical history significant for fibroids, ovarian cyst  Home Medications Prior to Admission medications   Medication Sig Start Date End Date Taking? Authorizing Provider  ondansetron (ZOFRAN-ODT) 4 MG disintegrating tablet Take 1 tablet (4 mg total) by mouth every 8 (eight) hours as needed. 03/31/23  Yes Sherian Maroon A, PA  oxyCODONE (ROXICODONE) 5 MG immediate release tablet Take 1 tablet (5 mg total) by mouth every 6 (six) hours as needed for severe pain (pain score 7-10). 03/31/23  Yes Sherian Maroon A, PA  famotidine (PEPCID) 20 MG tablet Take 20 mg by mouth 2 (two) times daily.    [provider]  ibuprofen (ADVIL,MOTRIN) 600 MG tablet Take 1 tablet (600 mg total) by mouth every 6 (six) hours as needed for moderate pain or cramping. 07/23/14   Marlow Baars, MD  JUNEL 1.5/30 1.5-30 MG-MCG tablet TAKE 1 TABLET BY MOUTH DAILY X 21D, IMMEDIATELY START NEW PACK 07/30/18   [provider]  Prenatal Vit-Fe Fumarate-FA (PRENATAL MULTIVITAMIN) TABS tablet Take 2 tablets by mouth daily.     [provider]      Allergies    Tetracyclines & related    Review of Systems   Review of  Systems  Gastrointestinal:  Positive for abdominal pain.    Physical Exam Updated Vital Signs BP (!) 139/90   Pulse 69   Temp 98.7 F (37.1 C) (Oral)   Resp 16   SpO2 99%  Physical Exam Vitals and nursing note reviewed.  Constitutional:      General: She is not in acute distress.    Appearance: She is well-developed.  HENT:     Head: Normocephalic and atraumatic.  Eyes:     Conjunctiva/sclera: Conjunctivae normal.  Cardiovascular:     Rate and Rhythm: Normal rate and regular rhythm.     Heart sounds: No murmur heard. Pulmonary:     Effort: Pulmonary effort is normal. No respiratory distress.     Breath sounds: Normal breath sounds.  Abdominal:     Palpations: Abdomen is soft.     Tenderness: There is abdominal tenderness in the right upper quadrant. There is no right CVA tenderness, left CVA tenderness or guarding. Negative signs include McBurney's sign.  Musculoskeletal:        General: No swelling.     Cervical back: Neck supple.  Skin:    General: Skin is warm and dry.     Capillary Refill: Capillary refill takes less than 2 seconds.  Neurological:     Mental Status: She is alert.  Psychiatric:        Mood and Affect: Mood normal.     ED Results / Procedures / Treatments  Labs (all labs ordered are listed, but only abnormal results are displayed) Labs Reviewed  COMPREHENSIVE METABOLIC PANEL - Abnormal; Notable for the following components:      Result Value   Sodium 133 (*)    Potassium 3.2 (*)    Glucose, Bld 135 (*)    ALT 48 (*)    All other components within normal limits  URINALYSIS, ROUTINE W REFLEX MICROSCOPIC - Abnormal; Notable for the following components:   Hgb urine dipstick TRACE (*)    All other components within normal limits  URINALYSIS, MICROSCOPIC (REFLEX) - Abnormal; Notable for the following components:   Bacteria, UA RARE (*)    All other components within normal limits  LIPASE, BLOOD  CBC  PREGNANCY, URINE  MAGNESIUM     EKG None  Radiology US Abdomen Limited RUQ (LIVER/GB) Result Date: 03/31/2023 CLINICAL DATA:  Epigastric pain. EXAM: ULTRASOUND ABDOMEN LIMITED RIGHT UPPER QUADRANT COMPARISON:  Ultrasound dated 10/03/2018. FINDINGS: Gallbladder: There is a 12 mm gallstone. No gallbladder wall thickening or pericholecystic fluid. Negative sonographic Murphy's sign. Common bile duct: Diameter: 4 mm Liver: There is diffuse increased liver echogenicity most commonly seen in the setting of fatty infiltration. Superimposed inflammation or fibrosis is not excluded. Clinical correlation is recommended. Portal vein is patent on color Doppler imaging with normal direction of blood flow towards the liver. Other: None. IMPRESSION: 1. Cholelithiasis without sonographic evidence of acute cholecystitis. 2. Fatty liver. Electronically Signed   By: Elgie Collard M.D.   On: 03/31/2023 20:35    Procedures Procedures    Medications Ordered in ED Medications  oxyCODONE-acetaminophen (PERCOCET/ROXICET) 5-325 MG per tablet 1 tablet (1 tablet Oral Given 03/31/23 1540)  ondansetron (ZOFRAN-ODT) disintegrating tablet 4 mg (4 mg Oral Given 03/31/23 1540)    ED Course/ Medical Decision Making/ A&P                                 Medical Decision Making Amount and/or Complexity of Data Reviewed Labs: ordered. Radiology: ordered.  Risk Prescription drug management.   This patient presents to the ED for concern of abdominal pain, this involves an extensive number of treatment options, and is a complaint that carries with it a high risk of complications and morbidity.  The differential diagnosis includes gastritis, PUD, CBD pathology cholecystitis, SBO/LBO, volvulus, diverticulitis, appendicitis, other   Co morbidities that complicate the patient evaluation  See HPI   Additional history obtained:  Additional history obtained from EMR External records from outside source obtained and reviewed including hospital  records   Lab Tests:  I Ordered, and personally interpreted labs.  The pertinent results include: No leukocytosis.  No evidence of anemia.  Platelets within normal range.  UA with rare bacteria, trace hemoglobin but otherwise unremarkable.  Hyponatremia, hypochloremia of 133 and 3.2 respectively.  Slight elevation of ALT of 48.  No renal dysfunction.  Urine pregnancy negative.  Lipase within normal limits.   Imaging Studies ordered:  I ordered imaging studies including right upper quad ultrasound I independently visualized and interpreted imaging which showed cholelithiasis I agree with the radiologist interpretation   Cardiac Monitoring: / EKG:  The patient was maintained on a cardiac monitor.  I personally viewed and interpreted the cardiac monitored which showed an underlying rhythm of: Send rhythm   Consultations Obtained:  N/a   Problem List / ED Course / Critical interventions / Medication management  Symptomatic cholelithiasis  I ordered medication  including Percocet, Zofran Reevaluation of the patient after these medicines showed that the patient improved I have reviewed the patients home medicines and have made adjustments as needed   Social Determinants of Health:  Secondhand smoke exposure.   Test / Admission - Considered:  Symptomatic cholelithiasis  Vitals signs within normal range and stable throughout visit. Laboratory/imaging studies significant for: See above 43 year old female presents emergency department with complaints of right upper abdominal pain.  Pain began yesterday after eating dinner and then recurred at lunchtime after eating lunch.  On exam, slight tenderness right upper quadrant.  Laboratory studies reassuring.  Rectal quadrant ultrasound did show evidence of cholelithiasis but without evidence of cholecystitis.  Patient treated with medications while the emergency department did not resolution of symptoms.  Suspect the patient's symptoms  likely secondary to symptomatic cholelithiasis.  Will treat patient's pain, nausea and perform referral for general surgery in the outpatient setting.  Patient given strict return precautions if pain persisting, development of fever, intractable nausea or vomiting.  Treatment plan discussed at length with patient and she acknowledged understanding was agreeable to said plan.  Patient overall well-appearing, afebrile in no acute distress. Worrisome signs and symptoms were discussed with the patient, and the patient acknowledged understanding to return to the ED if noticed. Patient was stable upon discharge.          Final Clinical Impression(s) / ED Diagnoses Final diagnoses:  Symptomatic cholelithiasis    Rx / DC Orders ED Discharge Orders          Ordered    Ambulatory referral to General Surgery        03/31/23 2102    oxyCODONE (ROXICODONE) 5 MG immediate release tablet  Every 6 hours PRN        03/31/23 2109    ondansetron (ZOFRAN-ODT) 4 MG disintegrating tablet  Every 8 hours PRN        03/31/23 2109              Peter Garter, PA 03/31/23 2305    Melene Plan, DO 03/31/23 2325

## 2023-03-31 NOTE — Discharge Instructions (Addendum)
 As discussed, you do have a stone in your gallbladder which is most likely causing your symptoms.  You do not have infection of your gallbladder.  In the situation, we typically treat the pain as well as the nausea and have you follow-up with a general surgeon.  I will do a referral to be seen by the general surgeon in the outpatient setting.  If you develop fever, pain that is not go away, please return to the emergency department.

## 2023-04-25 ENCOUNTER — Encounter (HOSPITAL_COMMUNITY): Payer: Self-pay

## 2023-04-25 ENCOUNTER — Observation Stay (HOSPITAL_COMMUNITY)
Admission: EM | Admit: 2023-04-25 | Discharge: 2023-04-26 | Disposition: A | Discharge status: Home / Self Care | Attending: General Surgery | Admitting: General Surgery

## 2023-04-25 ENCOUNTER — Emergency Department (HOSPITAL_COMMUNITY)

## 2023-04-25 ENCOUNTER — Other Ambulatory Visit: Payer: Self-pay

## 2023-04-25 DIAGNOSIS — Z87891 Personal history of nicotine dependence: Secondary | ICD-10-CM | POA: Insufficient documentation

## 2023-04-25 DIAGNOSIS — K8012 Calculus of gallbladder with acute and chronic cholecystitis without obstruction: Principal | ICD-10-CM | POA: Insufficient documentation

## 2023-04-25 DIAGNOSIS — K8050 Calculus of bile duct without cholangitis or cholecystitis without obstruction: Principal | ICD-10-CM

## 2023-04-25 DIAGNOSIS — R1011 Right upper quadrant pain: Secondary | ICD-10-CM | POA: Diagnosis present

## 2023-04-25 LAB — CBC WITH DIFFERENTIAL/PLATELET
Abs Immature Granulocytes: 0.07 10*3/uL (ref 0.00–0.07)
Basophils Absolute: 0 10*3/uL (ref 0.0–0.1)
Basophils Relative: 0 %
Eosinophils Absolute: 0.1 10*3/uL (ref 0.0–0.5)
Eosinophils Relative: 1 %
HCT: 46.4 % — ABNORMAL HIGH (ref 36.0–46.0)
Hemoglobin: 16.2 g/dL — ABNORMAL HIGH (ref 12.0–15.0)
Immature Granulocytes: 1 %
Lymphocytes Relative: 9 %
Lymphs Abs: 0.9 10*3/uL (ref 0.7–4.0)
MCH: 33.4 pg (ref 26.0–34.0)
MCHC: 34.9 g/dL (ref 30.0–36.0)
MCV: 95.7 fL (ref 80.0–100.0)
Monocytes Absolute: 0.6 10*3/uL (ref 0.1–1.0)
Monocytes Relative: 6 %
Neutro Abs: 8.1 10*3/uL — ABNORMAL HIGH (ref 1.7–7.7)
Neutrophils Relative %: 83 %
Platelets: 231 10*3/uL (ref 150–400)
RBC: 4.85 MIL/uL (ref 3.87–5.11)
RDW: 12.4 % (ref 11.5–15.5)
WBC: 9.7 10*3/uL (ref 4.0–10.5)
nRBC: 0 % (ref 0.0–0.2)

## 2023-04-25 LAB — COMPREHENSIVE METABOLIC PANEL
ALT: 56 U/L — ABNORMAL HIGH (ref 0–44)
AST: 30 U/L (ref 15–41)
Albumin: 4.3 g/dL (ref 3.5–5.0)
Alkaline Phosphatase: 84 U/L (ref 38–126)
Anion gap: 12 (ref 5–15)
BUN: 10 mg/dL (ref 6–20)
CO2: 22 mmol/L (ref 22–32)
Calcium: 8.7 mg/dL — ABNORMAL LOW (ref 8.9–10.3)
Chloride: 100 mmol/L (ref 98–111)
Creatinine, Ser: 0.68 mg/dL (ref 0.44–1.00)
GFR, Estimated: >60 mL/min (ref >60)
Glucose, Bld: 131 mg/dL — ABNORMAL HIGH (ref 70–99)
Potassium: 3.5 mmol/L (ref 3.5–5.1)
Sodium: 134 mmol/L — ABNORMAL LOW (ref 135–145)
Total Bilirubin: 0.7 mg/dL (ref 0.0–1.2)
Total Protein: 7.4 g/dL (ref 6.5–8.1)

## 2023-04-25 LAB — URINALYSIS, ROUTINE W REFLEX MICROSCOPIC
Bilirubin Urine: NEGATIVE
Glucose, UA: NEGATIVE mg/dL
Ketones, ur: NEGATIVE mg/dL
Leukocytes,Ua: NEGATIVE
Nitrite: NEGATIVE
Protein, ur: NEGATIVE mg/dL
Specific Gravity, Urine: 1.002 — ABNORMAL LOW (ref 1.005–1.030)
pH: 6 (ref 5.0–8.0)

## 2023-04-25 LAB — LIPASE, BLOOD: Lipase: 26 U/L (ref 11–51)

## 2023-04-25 LAB — HCG, SERUM, QUALITATIVE: Preg, Serum: NEGATIVE

## 2023-04-25 MED ORDER — TRAMADOL HCL 50 MG PO TABS: 50.0000 mg | ORAL_TABLET | Freq: Four times a day (QID) | ORAL | Status: DC | PRN

## 2023-04-25 MED ORDER — BISACODYL 10 MG RE SUPP: 10.0000 mg | Freq: Every day | RECTAL | Status: DC | PRN

## 2023-04-25 MED ORDER — KETOROLAC TROMETHAMINE 15 MG/ML IJ SOLN: 15.0000 mg | Freq: Four times a day (QID) | INTRAMUSCULAR | Status: DC | PRN

## 2023-04-25 MED ORDER — MELATONIN 3 MG PO TABS: 3.0000 mg | ORAL_TABLET | Freq: Every evening | ORAL | Status: DC | PRN

## 2023-04-25 MED ORDER — OXYCODONE HCL 5 MG PO TABS
5.0000 mg | ORAL_TABLET | ORAL | Status: DC | PRN
  Administered 2023-04-25 – 2023-04-26 (×2): 5 mg via ORAL
  Filled 2023-04-25 (×2): qty 1

## 2023-04-25 MED ORDER — FAMOTIDINE 20 MG PO TABS: 20.0000 mg | ORAL_TABLET | Freq: Every day | ORAL | Status: DC | PRN

## 2023-04-25 MED ORDER — HYDROMORPHONE HCL 1 MG/ML IJ SOLN
0.5000 mg | INTRAMUSCULAR | Status: DC | PRN
  Administered 2023-04-26: 0.5 mg via INTRAVENOUS
  Filled 2023-04-25: qty 1

## 2023-04-25 MED ORDER — SIMETHICONE 80 MG PO CHEW: 40.0000 mg | CHEWABLE_TABLET | Freq: Four times a day (QID) | ORAL | Status: DC | PRN

## 2023-04-25 MED ORDER — MORPHINE SULFATE (PF) 4 MG/ML IV SOLN
4.0000 mg | Freq: Once | INTRAVENOUS | Status: AC
  Administered 2023-04-25: 4 mg via INTRAVENOUS
  Filled 2023-04-25: qty 1

## 2023-04-25 MED ORDER — METHOCARBAMOL 1000 MG/10ML IJ SOLN: 500.0000 mg | Freq: Three times a day (TID) | INTRAMUSCULAR | Status: DC | PRN

## 2023-04-25 MED ORDER — INFLUENZA VIRUS VACC SPLIT PF (FLUZONE) 0.5 ML IM SUSY
0.5000 mL | PREFILLED_SYRINGE | INTRAMUSCULAR | Status: DC
  Filled 2023-04-25: qty 0.5

## 2023-04-25 MED ORDER — DOCUSATE SODIUM 100 MG PO CAPS
100.0000 mg | ORAL_CAPSULE | Freq: Two times a day (BID) | ORAL | Status: DC
  Administered 2023-04-25 – 2023-04-26 (×2): 100 mg via ORAL
  Filled 2023-04-25 (×2): qty 1

## 2023-04-25 MED ORDER — PANTOPRAZOLE SODIUM 40 MG IV SOLR
40.0000 mg | Freq: Every day | INTRAVENOUS | Status: DC
  Administered 2023-04-25: 40 mg via INTRAVENOUS
  Filled 2023-04-25: qty 10

## 2023-04-25 MED ORDER — ESCITALOPRAM OXALATE 10 MG PO TABS
5.0000 mg | ORAL_TABLET | Freq: Every day | ORAL | Status: DC
  Administered 2023-04-26: 5 mg via ORAL
  Filled 2023-04-25: qty 1

## 2023-04-25 MED ORDER — ENOXAPARIN SODIUM 40 MG/0.4ML IJ SOSY
40.0000 mg | PREFILLED_SYRINGE | INTRAMUSCULAR | Status: DC
  Administered 2023-04-25: 40 mg via SUBCUTANEOUS
  Filled 2023-04-25: qty 0.4

## 2023-04-25 MED ORDER — SODIUM CHLORIDE 0.9 % IV SOLN
2.0000 g | INTRAVENOUS | Status: DC
  Administered 2023-04-25: 2 g via INTRAVENOUS
  Filled 2023-04-25: qty 20

## 2023-04-25 MED ORDER — ONDANSETRON HCL 4 MG/2ML IJ SOLN
4.0000 mg | Freq: Four times a day (QID) | INTRAMUSCULAR | Status: DC | PRN
  Administered 2023-04-26: 4 mg via INTRAVENOUS

## 2023-04-25 MED ORDER — METOPROLOL TARTRATE 5 MG/5ML IV SOLN: 5.0000 mg | Freq: Four times a day (QID) | INTRAVENOUS | Status: DC | PRN

## 2023-04-25 MED ORDER — ACETAMINOPHEN 500 MG PO TABS
1000.0000 mg | ORAL_TABLET | Freq: Four times a day (QID) | ORAL | Status: DC
  Administered 2023-04-25 – 2023-04-26 (×3): 1000 mg via ORAL
  Filled 2023-04-25 (×4): qty 2

## 2023-04-25 MED ORDER — SODIUM CHLORIDE 0.9 % IV SOLN: INTRAVENOUS | Status: DC

## 2023-04-25 MED ORDER — ONDANSETRON HCL 4 MG/2ML IJ SOLN
4.0000 mg | Freq: Once | INTRAMUSCULAR | Status: AC
  Administered 2023-04-25: 4 mg via INTRAVENOUS
  Filled 2023-04-25: qty 2

## 2023-04-25 MED ORDER — DIPHENHYDRAMINE HCL 50 MG/ML IJ SOLN: 25.0000 mg | Freq: Four times a day (QID) | INTRAMUSCULAR | Status: DC | PRN

## 2023-04-25 MED ORDER — METHOCARBAMOL 500 MG PO TABS: 500.0000 mg | ORAL_TABLET | Freq: Three times a day (TID) | ORAL | Status: DC | PRN

## 2023-04-25 MED ORDER — DIPHENHYDRAMINE HCL 25 MG PO CAPS: 25.0000 mg | ORAL_CAPSULE | Freq: Four times a day (QID) | ORAL | Status: DC | PRN

## 2023-04-25 MED ORDER — ONDANSETRON 4 MG PO TBDP: 4.0000 mg | ORAL_TABLET | Freq: Four times a day (QID) | ORAL | Status: DC | PRN

## 2023-04-25 MED ORDER — HYDRALAZINE HCL 20 MG/ML IJ SOLN: 10.0000 mg | INTRAMUSCULAR | Status: DC | PRN

## 2023-04-25 NOTE — H&P (Incomplete)
 Surgical Evaluation  Chief Complaint: abdominal pain  HPI: Pt presents with intractable pain/nausea from gallstones. Was seen yesterday by Dr. Azucena Cecil and was offered direct admission for urgent surgery this weekend, but declined due to needing to take care of her husband who also recently had surgery. Was started on augmentin and pain/nausea meds and lap chole is scheduled for 3/21. Unofrtunately symptoms have been unmanageable at home.   Per office visit yesterday: "Patient was recently seen in ED in February with abdominal pain, nausea without emesis. She says she had a similar episode in 2020 that resolved. Since seen in ED patient states her pain waxes and wanes in severity but is always present to a degree. She is tearful over frustration with her pain and discomfort. She reports frequent nausea without emesis. She is able to tolerate PO and hydrate. US showed cholelithiasis without evidence of cholecystitis or choledocholithiasis. Labs overall normal, minimal increase in ALT to 48. "  Allergies  Allergen Reactions   Tetracyclines & Related Hives and Rash    Past Medical History:  Diagnosis Date   Fibroids    Ovarian cyst     Past Surgical History:  Procedure Laterality Date   APPENDECTOMY     CESAREAN SECTION  2008   CESAREAN SECTION N/A 07/21/2014   Procedure: CESAREAN SECTION;  Surgeon: Marlow Baars, MD;  Location: [facility];  Service: Obstetrics;  Laterality: N/A;   DILATION AND EVACUATION N/A 06/17/2013   Procedure: DILATATION AND EVACUATION;  Surgeon: Ok Edwards, MD;  Location: [facility];  Service: Gynecology;  Laterality: N/A;   HYSTEROSCOPY WITH RESECTOSCOPE N/A 06/17/2013   Procedure: HYSTEROSCOPY WITH RESECTOSCOPE;  Surgeon: Ok Edwards, MD;  Location: [facility];  Service: Gynecology;  Laterality: N/A;  RESECTOSCOPIC MYOMECTOMY WITH MYOSURE-REP TO BE PRESENT.     INTRAUTERINE DEVICE INSERTION  12/2009   mirena   KNEE RECONSTRUCTION, MEDIAL PATELLAR FEMORAL LIGAMENT  1999     Family History  Problem Relation Age of Onset   Hypertension Mother     Social History   Socioeconomic History   Marital status: Married    Spouse name: Not on file   Number of children: Not on file   Years of education: Not on file   Highest education level: Not on file  Occupational History   Not on file  Tobacco Use   Smoking status: Former    Current packs/day: 0.00    Types: Cigarettes    Quit date: 11/02/2013    Years since quitting: 9.4   Smokeless tobacco: Never  Substance and Sexual Activity   Alcohol use: Yes    Comment: social   Drug use: No   Sexual activity: Yes    Birth control/protection: None  Other Topics Concern   Not on file  Social History Narrative   Not on file   Social Drivers of Health   Financial Resource Strain: Not on file  Food Insecurity: No Food Insecurity (11/05/2021)   Received from [facility], [facility]   Hunger Vital Sign    Worried About Running Out of Food in the Last Year: Never true    Ran Out of Food in the Last Year: Never true  Transportation Needs: Not on file  Physical Activity: Not on file  Stress: Not on file  Social Connections: Unknown (06/22/2021)   Received from [facility], [facility]   Social Network    Social Network: Not on file    No current facility-administered medications on file prior  to encounter.   Current Outpatient Medications on File Prior to Encounter  Medication Sig Dispense Refill   amoxicillin-clavulanate (AUGMENTIN) 875-125 MG tablet Take 1 tablet by mouth 2 (two) times daily.     amphetamine-dextroamphetamine (ADDERALL XR) 30 MG 24 hr capsule Take 30 mg by mouth daily.     escitalopram (LEXAPRO) 10 MG tablet Take 5 mg by mouth daily.     famotidine (PEPCID) 20 MG tablet Take 20 mg by mouth daily as needed for heartburn.     ondansetron (ZOFRAN-ODT) 4 MG disintegrating tablet Take 1 tablet (4 mg total) by mouth every 8 (eight) hours as needed. 20 tablet 0   oxyCODONE  (ROXICODONE) 5 MG immediate release tablet Take 1 tablet (5 mg total) by mouth every 6 (six) hours as needed for severe pain (pain score 7-10). 10 tablet 0    Review of Systems: a complete, 10pt review of systems was completed with pertinent positives and negatives as documented in the HPI  Physical Exam: Vitals:   04/25/23 1600 04/25/23 1630  BP: 114/73 115/82  Pulse: 88 (!) 104  Resp:  18  Temp:    SpO2: 90% 92%   Gen: A&Ox3, no distress  Eyes: lids and conjunctivae normal, no icterus. Pupils equally round and reactive to light.  Neck: supple without mass or thyromegaly Chest: respiratory effort is normal. No crepitus or tenderness on palpation of the chest. Breath sounds equal.  Cardiovascular: RRR with palpable distal pulses, no pedal edema*** Gastrointestinal: ***soft, nondistended, ***tender. No mass, hepatomegaly or splenomegaly.  Muscoloskeletal: no clubbing or cyanosis of the fingers.  Strength is symmetrical throughout.  Range of motion of bilateral upper and lower extremities normal without pain, crepitation or contracture. Neuro: cranial nerves grossly intact.  Sensation intact to light touch diffusely. Psych: appropriate mood and affect, normal insight/judgment intact  Skin: warm and dry      Latest Ref Rng & Units 04/25/2023    2:31 PM 03/31/2023    3:34 PM 10/03/2018   10:35 AM  CBC  WBC 4.0 - 10.5 K/uL 9.7  9.9  5.8   Hemoglobin 12.0 - 15.0 g/dL 16.1  09.6  04.5   Hematocrit 36.0 - 46.0 % 46.4  42.1  33.7   Platelets 150 - 400 K/uL 231  283  234        Latest Ref Rng & Units 04/25/2023    2:31 PM 03/31/2023    3:34 PM 10/03/2018   10:35 AM  CMP  Glucose 70 - 99 mg/dL 409  811  914   BUN 6 - 20 mg/dL 10  9  8    Creatinine 0.44 - 1.00 mg/dL 7.82  9.56  2.13   Sodium 135 - 145 mmol/L 134  133  138   Potassium 3.5 - 5.1 mmol/L 3.5  3.2  4.0   Chloride 98 - 111 mmol/L 100  100  107   CO2 22 - 32 mmol/L 22  27  24    Calcium 8.9 - 10.3 mg/dL 8.7  9.1  8.6    Total Protein 6.5 - 8.1 g/dL 7.4  7.1  6.6   Total Bilirubin 0.0 - 1.2 mg/dL 0.7  0.6  0.5   Alkaline Phos 38 - 126 U/L 84  78  59   AST 15 - 41 U/L 30  29  21    ALT 0 - 44 U/L 56  48  29     No results found for: "INR", "PROTIME"  Imaging: US Abdomen Limited  RUQ (LIVER/GB) Result Date: 04/25/2023 CLINICAL DATA:  [id number] Cholelithiasis [id number] Epigastric pain [id number] RUQ abdominal pain [id number] EXAM: ULTRASOUND ABDOMEN LIMITED RIGHT UPPER QUADRANT COMPARISON:  03/31/2023 FINDINGS: Gallbladder: Small stone within the gallbladder. No pericholecystic fluid or wall thickening visualized. No sonographic Murphy sign noted by sonographer. Common bile duct: Diameter: 4 mm. Liver: Diffusely increased hepatic parenchymal echogenicity. Probable area of focal fatty sparing adjacent to the gallbladder fossa without internal vascularity. Otherwise, no liver lesion is identified. Portal vein is patent on color Doppler imaging with normal direction of blood flow towards the liver. Other: None. IMPRESSION: 1. Cholelithiasis without sonographic evidence of acute cholecystitis. 2. The echogenicity of the liver is increased. This is a nonspecific finding but is most commonly seen with fatty infiltration of the liver. There are no obvious focal liver lesions. Electronically Signed   By: Duanne Guess D.O.   On: 04/25/2023 16:47     A/P: Intractable pain, likely cholecystitis.     Patient Active Problem List   Diagnosis Date Noted   Biliary colic symptom 95/62/1308   Status post repeat low transverse cesarean section 07/21/2014   Encounter for trial of labor 07/20/2014       Phylliss Blakes, MD [facility] Surgery  See AMION to contact appropriate on-call provider

## 2023-04-25 NOTE — ED Provider Notes (Signed)
 [place] EMERGENCY DEPARTMENT AT [facility] Provider Note   CSN: [id number] Arrival date & time: 04/25/23  1428     History  Chief Complaint  Patient presents with   Abdominal Pain    Chelsea Lam is a 43 y.o. female.   Abdominal Pain  Patient is a 43 year old female presents the ED today complaining of right upper quadrant and epigastric abdominal pain for 1 month.  Scheduled for cholecystectomy on 05/01/2023 after being seen by general surgery at [facility] yesterday was told that she was needing surgery that day but opted to try antibiotics and pain medication at home due to patient's husband also having recently undergone surgery.  However came to the ED today due to her increased pain and saying that she also had a low-grade fever of Tmax 99.5.  Last oral intake was at 12 PM today with rice and antibiotics.  Endorses nausea  Denies vomiting, shortness of breath, melena, hematochezia, dysuria, lower leg swelling    Home Medications Prior to Admission medications   Medication Sig Start Date End Date Taking? Authorizing Provider  amoxicillin-clavulanate (AUGMENTIN) 875-125 MG tablet Take 1 tablet by mouth 2 (two) times daily. 04/24/23 05/04/23 Yes [provider]  amphetamine-dextroamphetamine (ADDERALL XR) 30 MG 24 hr capsule Take 30 mg by mouth daily. 04/10/23  Yes [provider]  escitalopram (LEXAPRO) 10 MG tablet Take 5 mg by mouth daily. 09/15/22  Yes [provider]  famotidine (PEPCID) 20 MG tablet Take 20 mg by mouth daily as needed for heartburn.   Yes [provider]  ondansetron (ZOFRAN-ODT) 4 MG disintegrating tablet Take 1 tablet (4 mg total) by mouth every 8 (eight) hours as needed. 03/31/23  Yes Sherian Maroon A, PA  oxyCODONE (ROXICODONE) 5 MG immediate release tablet Take 1 tablet (5 mg total) by mouth every 6 (six) hours as needed for severe pain (pain score 7-10). 03/31/23  Yes Sherian Maroon A, PA       Allergies    Tetracyclines & related    Review of Systems   Review of Systems  Gastrointestinal:  Positive for abdominal pain.  All other systems reviewed and are negative.   Physical Exam Updated Vital Signs BP 115/82   Pulse (!) 104   Temp 99.2 F (37.3 C) (Oral)   Resp 18   Ht 5\' 2"  (1.575 m)   Wt 90.7 kg   SpO2 92%   BMI 36.57 kg/m  Physical Exam Vitals and nursing note reviewed.  Constitutional:      Appearance: Normal appearance.     Comments: And tearful on exam  HENT:     Head: Normocephalic and atraumatic.  Eyes:     General: No scleral icterus.    Extraocular Movements: Extraocular movements intact.     Conjunctiva/sclera: Conjunctivae normal.  Cardiovascular:     Rate and Rhythm: Normal rate and regular rhythm.     Pulses: Normal pulses.     Heart sounds: Normal heart sounds. No murmur heard.    No friction rub. No gallop.  Pulmonary:     Effort: Pulmonary effort is normal. No respiratory distress.     Breath sounds: Normal breath sounds. No stridor. No wheezing, rhonchi or rales.  Abdominal:     General: Abdomen is flat. There is no distension.     Palpations: Abdomen is soft.     Tenderness: There is abdominal tenderness in the right upper quadrant and epigastric area. There is no right CVA tenderness, left CVA  tenderness, guarding or rebound. Positive signs include Murphy's sign. Negative signs include Rovsing's sign, McBurney's sign and psoas sign.  Skin:    General: Skin is warm and dry.     Coloration: Skin is not jaundiced or pale.     Findings: No erythema.  Neurological:     General: No focal deficit present.     Mental Status: She is alert and oriented to person, place, and time. Mental status is at baseline.     Motor: No weakness.  Psychiatric:        Mood and Affect: Mood normal.     ED Results / Procedures / Treatments   Labs (all labs ordered are listed, but only abnormal results are displayed) Labs Reviewed  COMPREHENSIVE  METABOLIC PANEL - Abnormal; Notable for the following components:      Result Value   Sodium 134 (*)    Glucose, Bld 131 (*)    Calcium 8.7 (*)    ALT 56 (*)    All other components within normal limits  CBC WITH DIFFERENTIAL/PLATELET - Abnormal; Notable for the following components:   Hemoglobin 16.2 (*)    HCT 46.4 (*)    Neutro Abs 8.1 (*)    All other components within normal limits  URINALYSIS, ROUTINE W REFLEX MICROSCOPIC - Abnormal; Notable for the following components:   Color, Urine COLORLESS (*)    Specific Gravity, Urine 1.002 (*)    Hgb urine dipstick SMALL (*)    Bacteria, UA RARE (*)    All other components within normal limits  LIPASE, BLOOD  HCG, SERUM, QUALITATIVE  COMPREHENSIVE METABOLIC PANEL  MAGNESIUM  CBC    EKG None  Radiology US Abdomen Limited RUQ (LIVER/GB) Result Date: 04/25/2023 CLINICAL DATA:  [id number] Cholelithiasis [id number] Epigastric pain 114842 151470 RUQ abdominal pain 151470 EXAM: ULTRASOUND ABDOMEN LIMITED RIGHT UPPER QUADRANT COMPARISON:  03/31/2023 FINDINGS: Gallbladder: Small stone within the gallbladder. No pericholecystic fluid or wall thickening visualized. No sonographic Murphy sign noted by sonographer. Common bile duct: Diameter: 4 mm. Liver: Diffusely increased hepatic parenchymal echogenicity. Probable area of focal fatty sparing adjacent to the gallbladder fossa without internal vascularity. Otherwise, no liver lesion is identified. Portal vein is patent on color Doppler imaging with normal direction of blood flow towards the liver. Other: None. IMPRESSION: 1. Cholelithiasis without sonographic evidence of acute cholecystitis. 2. The echogenicity of the liver is increased. This is a nonspecific finding but is most commonly seen with fatty infiltration of the liver. There are no obvious focal liver lesions. Electronically Signed   By: Duanne Guess D.O.   On: 04/25/2023 16:47    Procedures Procedures    Medications Ordered in  ED Medications  enoxaparin (LOVENOX) injection 40 mg (has no administration in time range)  0.9 %  sodium chloride infusion (has no administration in time range)  acetaminophen (TYLENOL) tablet 1,000 mg (has no administration in time range)  ketorolac (TORADOL) 15 MG/ML injection 15 mg (has no administration in time range)  traMADol (ULTRAM) tablet 50 mg (has no administration in time range)  oxyCODONE (Oxy IR/ROXICODONE) immediate release tablet 5 mg (has no administration in time range)  HYDROmorphone (DILAUDID) injection 0.5-1 mg (has no administration in time range)  methocarbamol (ROBAXIN) tablet 500 mg (has no administration in time range)    Or  methocarbamol (ROBAXIN) injection 500 mg (has no administration in time range)  melatonin tablet 3 mg (has no administration in time range)  diphenhydrAMINE (BENADRYL) capsule 25 mg (has no  administration in time range)    Or  diphenhydrAMINE (BENADRYL) injection 25 mg (has no administration in time range)  docusate sodium (COLACE) capsule 100 mg (has no administration in time range)  bisacodyl (DULCOLAX) suppository 10 mg (has no administration in time range)  ondansetron (ZOFRAN-ODT) disintegrating tablet 4 mg (has no administration in time range)    Or  ondansetron (ZOFRAN) injection 4 mg (has no administration in time range)  simethicone (MYLICON) chewable tablet 40 mg (has no administration in time range)  pantoprazole (PROTONIX) injection 40 mg (has no administration in time range)  metoprolol tartrate (LOPRESSOR) injection 5 mg (has no administration in time range)  hydrALAZINE (APRESOLINE) injection 10 mg (has no administration in time range)  cefTRIAXone (ROCEPHIN) 2 g in sodium chloride 0.9 % 100 mL IVPB (has no administration in time range)  escitalopram (LEXAPRO) tablet 5 mg (has no administration in time range)  famotidine (PEPCID) tablet 20 mg (has no administration in time range)  morphine (PF) 4 MG/ML injection 4 mg (4 mg  Intravenous Given 04/25/23 1521)  ondansetron (ZOFRAN) injection 4 mg (4 mg Intravenous Given 04/25/23 1522)    ED Course/ Medical Decision Making/ A&P                                 Medical Decision Making Amount and/or Complexity of Data Reviewed Labs: ordered.   Patient is a 43 year old female presents the ED today complaining of right upper quadrant and epigastric abdominal pain for 1 month.  Scheduled for cholecystectomy on 05/01/2023 after being seen by general surgery at [facility] yesterday was told that she was needing surgery that day but opted to try antibiotics and pain medication at home due to patient's husband also having recently undergone surgery.  However came to the ED today due to her increased pain and saying that she also had a low-grade fever of Tmax 99.5.  Last oral intake was at 12 PM today with rice and antibiotics.  On physical exam, patient is noted to be tearful, tachycardic, notably in pain but alert and oriented x 4 and able to speak in full sentences.  Physical exam was notable for epigastric and right upper quadrant tenderness.  Murphy sign positive.  Negative Rovsing's, negative McBurney's point.  No rebound tenderness noted.  LCTAB.  Unremarkable exam otherwise.  Provided morphine and Zofran on reevaluation, patient was noted to have symptoms greatly improved.  Labs were drawn and did not show any elevated white count.  CMP did show a elevated ALT 56 but was otherwise unremarkable and UA was also unremarkable.  I required ultrasound did show cholelithiasis without signs of acute cholecystitis.  Due to patient's increased pain and being unable to tolerate pain medication and antibiotics at home, she is wishing still to move forward with surgery as she was supposed to be a direct admit yesterday but denied.  Called general surgery and spoke to Dr. Doylene Canard who stated that she would look to put in the admission orders for this patient as well as bring her in for a sooner  surgery due to being unable to tolerate medications at home for pain control and antibiotics.  Reevaluated patient afterward and patient is resting comfortably with current pain medication.  Awaiting admission orders from general surgery who stated that she will be rounded on either later this evening or tomorrow morning.  Differential diagnoses prior to evaluation: The emergent differential diagnosis includes, but is  not limited to, cholecystitis, cholangitis, choledocholithiasis, pancreatitis, gastritis, ACS, PE, aortic aneurysm. This is not an exhaustive differential.   Past Medical History / Co-morbidities / Social History: Fibroids, cholelithiasis, ovarian cyst  Appendectomy  Additional history: Chart reviewed. Pertinent results include: Seen by general surgery yesterday Dr. Azucena Cecil at [facility] surgery who has scheduled her for a cholecystectomy on 05/01/2023.  Was offered direct admission but declined due to wishing to go with antibiotics and pain control because of patient's husband recently undergoing surgery.  Taking Augmentin and oxycodone.  Lab Tests/Imaging studies: I personally interpreted labs/imaging and the pertinent results include:   CBC was notable for an elevated hemoglobin at 16.2 CMP is notable for mild hyponatremia and trending of 134 and mild hypocalcemia of 8.7 and elevated ALT 56. UA notable for a decrease this is a primary complaint of vomiting  Right quadrant ultrasound did show cholelithiasis without evidence of acute cholecystitis with an increased echogenicity of the liver. I agree with the radiologist interpretation.    Medications: I ordered medication including morphine and zofran.  I have reviewed the patients home medicines and have made adjustments as needed.  Disposition: After consideration of the diagnostic results and the patients response to treatment, I feel that the patient benefit from admission and continued care with Dr. Fredricka Bonine with  general surgery.  Final Clinical Impression(s) / ED Diagnoses Final diagnoses:  Biliary colic    Rx / DC Orders ED Discharge Orders     None         Lavonia Drafts 04/25/23 1728    Gerhard Munch, MD 04/25/23 2314

## 2023-04-25 NOTE — ED Triage Notes (Signed)
 Pt reports with RUQ pain. Pt is scheduled to have her gall bladder removed on Friday.

## 2023-04-26 ENCOUNTER — Observation Stay (HOSPITAL_COMMUNITY): Admitting: Certified Registered"

## 2023-04-26 ENCOUNTER — Other Ambulatory Visit: Payer: Self-pay

## 2023-04-26 ENCOUNTER — Observation Stay (HOSPITAL_BASED_OUTPATIENT_CLINIC_OR_DEPARTMENT_OTHER): Admitting: Certified Registered"

## 2023-04-26 ENCOUNTER — Encounter (HOSPITAL_COMMUNITY)
Admission: EM | Disposition: A | Discharge status: Home / Self Care | Payer: Self-pay | Source: Home / Self Care | Attending: Emergency Medicine

## 2023-04-26 DIAGNOSIS — K8000 Calculus of gallbladder with acute cholecystitis without obstruction: Secondary | ICD-10-CM | POA: Diagnosis not present

## 2023-04-26 HISTORY — PX: CHOLECYSTECTOMY: SHX55

## 2023-04-26 LAB — CBC
HCT: 45.2 % (ref 36.0–46.0)
Hemoglobin: 14.9 g/dL (ref 12.0–15.0)
MCH: 33 pg (ref 26.0–34.0)
MCHC: 33 g/dL (ref 30.0–36.0)
MCV: 100.2 fL — ABNORMAL HIGH (ref 80.0–100.0)
Platelets: 226 10*3/uL (ref 150–400)
RBC: 4.51 MIL/uL (ref 3.87–5.11)
RDW: 12.6 % (ref 11.5–15.5)
WBC: 4.6 10*3/uL (ref 4.0–10.5)
nRBC: 0 % (ref 0.0–0.2)

## 2023-04-26 LAB — SURGICAL PCR SCREEN
MRSA, PCR: NEGATIVE
Staphylococcus aureus: NEGATIVE

## 2023-04-26 LAB — COMPREHENSIVE METABOLIC PANEL
ALT: 44 U/L (ref 0–44)
AST: 27 U/L (ref 15–41)
Albumin: 3.5 g/dL (ref 3.5–5.0)
Alkaline Phosphatase: 74 U/L (ref 38–126)
Anion gap: 6 (ref 5–15)
BUN: 7 mg/dL (ref 6–20)
CO2: 27 mmol/L (ref 22–32)
Calcium: 8.3 mg/dL — ABNORMAL LOW (ref 8.9–10.3)
Chloride: 104 mmol/L (ref 98–111)
Creatinine, Ser: 0.67 mg/dL (ref 0.44–1.00)
GFR, Estimated: >60 mL/min (ref >60)
Glucose, Bld: 119 mg/dL — ABNORMAL HIGH (ref 70–99)
Potassium: 4.5 mmol/L (ref 3.5–5.1)
Sodium: 137 mmol/L (ref 135–145)
Total Bilirubin: 0.7 mg/dL (ref 0.0–1.2)
Total Protein: 6.3 g/dL — ABNORMAL LOW (ref 6.5–8.1)

## 2023-04-26 LAB — MAGNESIUM: Magnesium: 2.3 mg/dL (ref 1.7–2.4)

## 2023-04-26 SURGERY — LAPAROSCOPIC CHOLECYSTECTOMY
Anesthesia: General | Site: Abdomen

## 2023-04-26 MED ORDER — LIDOCAINE HCL (PF) 2 % IJ SOLN
INTRAMUSCULAR | Status: AC
  Filled 2023-04-26: qty 5

## 2023-04-26 MED ORDER — CEFAZOLIN SODIUM-DEXTROSE 2-4 GM/100ML-% IV SOLN
INTRAVENOUS | Status: DC
Start: 2023-04-26 — End: 2023-04-26
  Filled 2023-04-26: qty 100

## 2023-04-26 MED ORDER — FENTANYL CITRATE PF 50 MCG/ML IJ SOSY
25.0000 ug | PREFILLED_SYRINGE | INTRAMUSCULAR | Status: DC | PRN
  Administered 2023-04-26 (×3): 50 ug via INTRAVENOUS

## 2023-04-26 MED ORDER — LACTATED RINGERS IR SOLN
Status: DC | PRN
  Administered 2023-04-26: 1000 mL

## 2023-04-26 MED ORDER — PROPOFOL 10 MG/ML IV BOLUS
INTRAVENOUS | Status: AC
  Filled 2023-04-26: qty 20

## 2023-04-26 MED ORDER — FENTANYL CITRATE (PF) 100 MCG/2ML IJ SOLN
INTRAMUSCULAR | Status: DC | PRN
  Administered 2023-04-26 (×2): 50 ug via INTRAVENOUS

## 2023-04-26 MED ORDER — DEXAMETHASONE SODIUM PHOSPHATE 4 MG/ML IJ SOLN
INTRAMUSCULAR | Status: DC | PRN
  Administered 2023-04-26: 5 mg via INTRAVENOUS

## 2023-04-26 MED ORDER — CEFAZOLIN SODIUM-DEXTROSE 2-4 GM/100ML-% IV SOLN: 2.0000 g | INTRAVENOUS | Status: DC

## 2023-04-26 MED ORDER — MIDAZOLAM HCL 2 MG/2ML IJ SOLN
INTRAMUSCULAR | Status: DC | PRN
  Administered 2023-04-26: 2 mg via INTRAVENOUS

## 2023-04-26 MED ORDER — FENTANYL CITRATE PF 50 MCG/ML IJ SOSY
PREFILLED_SYRINGE | INTRAMUSCULAR | Status: AC
  Filled 2023-04-26: qty 3

## 2023-04-26 MED ORDER — KETOROLAC TROMETHAMINE 30 MG/ML IJ SOLN
INTRAMUSCULAR | Status: DC | PRN
  Administered 2023-04-26: 30 mg via INTRAVENOUS

## 2023-04-26 MED ORDER — SCOPOLAMINE 1 MG/3DAYS TD PT72
MEDICATED_PATCH | TRANSDERMAL | Status: AC
Start: 2023-04-26 — End: 2023-04-26
  Filled 2023-04-26: qty 1

## 2023-04-26 MED ORDER — METHOCARBAMOL 500 MG PO TABS: 500.0000 mg | ORAL_TABLET | Freq: Three times a day (TID) | ORAL | 1 refills | Status: AC | PRN

## 2023-04-26 MED ORDER — CEFAZOLIN SODIUM-DEXTROSE 2-3 GM-%(50ML) IV SOLR
INTRAVENOUS | Status: DC | PRN
  Administered 2023-04-26: 2 g via INTRAVENOUS

## 2023-04-26 MED ORDER — CEFAZOLIN SODIUM-DEXTROSE 2-4 GM/100ML-% IV SOLN
INTRAVENOUS | Status: AC
  Filled 2023-04-26: qty 100

## 2023-04-26 MED ORDER — ONDANSETRON HCL 4 MG/2ML IJ SOLN
INTRAMUSCULAR | Status: AC
Start: 2023-04-26 — End: ?
  Filled 2023-04-26: qty 2

## 2023-04-26 MED ORDER — PHENYLEPHRINE 80 MCG/ML (10ML) SYRINGE FOR IV PUSH (FOR BLOOD PRESSURE SUPPORT)
PREFILLED_SYRINGE | INTRAVENOUS | Status: DC | PRN
  Administered 2023-04-26: 120 ug via INTRAVENOUS

## 2023-04-26 MED ORDER — AMISULPRIDE (ANTIEMETIC) 5 MG/2ML IV SOLN
10.0000 mg | Freq: Once | INTRAVENOUS | Status: AC | PRN
  Administered 2023-04-26: 10 mg via INTRAVENOUS

## 2023-04-26 MED ORDER — FENTANYL CITRATE (PF) 100 MCG/2ML IJ SOLN
INTRAMUSCULAR | Status: AC
  Filled 2023-04-26: qty 2

## 2023-04-26 MED ORDER — SUGAMMADEX SODIUM 200 MG/2ML IV SOLN
INTRAVENOUS | Status: DC | PRN
  Administered 2023-04-26: 200 mg via INTRAVENOUS

## 2023-04-26 MED ORDER — LIDOCAINE HCL (CARDIAC) PF 100 MG/5ML IV SOSY
PREFILLED_SYRINGE | INTRAVENOUS | Status: DC | PRN
  Administered 2023-04-26: 80 mg via INTRAVENOUS

## 2023-04-26 MED ORDER — PHENYLEPHRINE 80 MCG/ML (10ML) SYRINGE FOR IV PUSH (FOR BLOOD PRESSURE SUPPORT)
PREFILLED_SYRINGE | INTRAVENOUS | Status: AC
  Filled 2023-04-26: qty 10

## 2023-04-26 MED ORDER — SUGAMMADEX SODIUM 200 MG/2ML IV SOLN
INTRAVENOUS | Status: AC
  Filled 2023-04-26: qty 2

## 2023-04-26 MED ORDER — ROCURONIUM BROMIDE 10 MG/ML (PF) SYRINGE
PREFILLED_SYRINGE | INTRAVENOUS | Status: DC | PRN
  Administered 2023-04-26: 50 mg via INTRAVENOUS

## 2023-04-26 MED ORDER — LIDOCAINE HCL 1 % IJ SOLN
INTRAMUSCULAR | Status: DC | PRN
  Administered 2023-04-26: 20 mL

## 2023-04-26 MED ORDER — MIDAZOLAM HCL 2 MG/2ML IJ SOLN
INTRAMUSCULAR | Status: AC
  Filled 2023-04-26: qty 2

## 2023-04-26 MED ORDER — OXYCODONE HCL 5 MG PO TABS: 5.0000 mg | ORAL_TABLET | Freq: Four times a day (QID) | ORAL | 0 refills | Status: AC | PRN

## 2023-04-26 MED ORDER — DOCUSATE SODIUM 100 MG PO CAPS: 100.0000 mg | ORAL_CAPSULE | Freq: Two times a day (BID) | ORAL | 0 refills | Status: AC

## 2023-04-26 MED ORDER — BUPIVACAINE-EPINEPHRINE (PF) 0.25% -1:200000 IJ SOLN
INTRAMUSCULAR | Status: AC
  Filled 2023-04-26: qty 30

## 2023-04-26 MED ORDER — LACTATED RINGERS IV SOLN
INTRAVENOUS | Status: DC | PRN
Start: 2023-04-26 — End: 2023-04-26

## 2023-04-26 MED ORDER — AMISULPRIDE (ANTIEMETIC) 5 MG/2ML IV SOLN
INTRAVENOUS | Status: AC
  Filled 2023-04-26: qty 4

## 2023-04-26 MED ORDER — INDOCYANINE GREEN 25 MG IV SOLR
1.2500 mg | Freq: Once | INTRAVENOUS | Status: AC
  Administered 2023-04-26: 1.25 mg via INTRAVENOUS
  Filled 2023-04-26: qty 10

## 2023-04-26 MED ORDER — OXYCODONE HCL 5 MG/5ML PO SOLN: 5.0000 mg | Freq: Once | ORAL | Status: DC | PRN

## 2023-04-26 MED ORDER — LIDOCAINE HCL (PF) 1 % IJ SOLN
INTRAMUSCULAR | Status: AC
  Filled 2023-04-26: qty 30

## 2023-04-26 MED ORDER — DEXAMETHASONE SODIUM PHOSPHATE 10 MG/ML IJ SOLN
INTRAMUSCULAR | Status: AC
  Filled 2023-04-26: qty 1

## 2023-04-26 MED ORDER — ROCURONIUM BROMIDE 10 MG/ML (PF) SYRINGE
PREFILLED_SYRINGE | INTRAVENOUS | Status: AC
  Filled 2023-04-26: qty 10

## 2023-04-26 MED ORDER — KETOROLAC TROMETHAMINE 30 MG/ML IJ SOLN
INTRAMUSCULAR | Status: AC
  Filled 2023-04-26: qty 1

## 2023-04-26 MED ORDER — PROPOFOL 10 MG/ML IV BOLUS
INTRAVENOUS | Status: DC | PRN
  Administered 2023-04-26: 150 mg via INTRAVENOUS

## 2023-04-26 MED ORDER — OXYCODONE HCL 5 MG PO TABS: 5.0000 mg | ORAL_TABLET | Freq: Once | ORAL | Status: DC | PRN

## 2023-04-26 MED ORDER — SCOPOLAMINE 1 MG/3DAYS TD PT72
MEDICATED_PATCH | TRANSDERMAL | Status: DC | PRN
  Administered 2023-04-26: 1 via TRANSDERMAL

## 2023-04-26 SURGICAL SUPPLY — 36 items
APPLIER CLIP ROT 10 11.4 M/L (STAPLE) ×1 IMPLANT
BAG COUNTER SPONGE SURGICOUNT (BAG) IMPLANT
CHLORAPREP W/TINT 26 (MISCELLANEOUS) ×1 IMPLANT
CLIP APPLIE ROT 10 11.4 M/L (STAPLE) ×1 IMPLANT
CLIP LIGATING HEMO LOK XL GOLD (MISCELLANEOUS) ×1 IMPLANT
CLIP LIGATING HEMO O LOK GREEN (MISCELLANEOUS) IMPLANT
COVER MAYO STAND STRL (DRAPES) IMPLANT
COVER SURGICAL LIGHT HANDLE (MISCELLANEOUS) ×1 IMPLANT
DERMABOND ADVANCED .7 DNX12 (GAUZE/BANDAGES/DRESSINGS) IMPLANT
DRAPE C-ARM 42X120 X-RAY (DRAPES) IMPLANT
ELECT REM PT RETURN 15FT ADLT (MISCELLANEOUS) ×1 IMPLANT
GLOVE BIO SURGEON STRL SZ 6 (GLOVE) ×1 IMPLANT
GLOVE INDICATOR 6.5 STRL GRN (GLOVE) ×1 IMPLANT
GOWN STRL REUS W/ TWL XL LVL3 (GOWN DISPOSABLE) ×1 IMPLANT
HEMOSTAT SNOW SURGICEL 2X4 (HEMOSTASIS) IMPLANT
IRRIG SUCT STRYKERFLOW 2 WTIP (MISCELLANEOUS) ×1 IMPLANT
IRRIGATION SUCT STRKRFLW 2 WTP (MISCELLANEOUS) ×1 IMPLANT
KIT BASIN OR (CUSTOM PROCEDURE TRAY) ×1 IMPLANT
KIT TURNOVER KIT A (KITS) IMPLANT
L-HOOK LAP DISP 36CM (ELECTROSURGICAL) ×1 IMPLANT
LHOOK LAP DISP 36CM (ELECTROSURGICAL) ×1 IMPLANT
PENCIL SMOKE EVACUATOR (MISCELLANEOUS) IMPLANT
PROTECTOR NERVE ULNAR (MISCELLANEOUS) IMPLANT
SCISSORS LAP 5X35 DISP (ENDOMECHANICALS) ×1 IMPLANT
SET CHOLANGIOGRAPH MIX (MISCELLANEOUS) IMPLANT
SET TUBE SMOKE EVAC HIGH FLOW (TUBING) ×1 IMPLANT
SLEEVE Z-THREAD 5X100MM (TROCAR) ×1 IMPLANT
SPIKE FLUID TRANSFER (MISCELLANEOUS) ×1 IMPLANT
SUT MNCRL AB 4-0 PS2 18 (SUTURE) ×1 IMPLANT
SYS BAG RETRIEVAL 10MM (BASKET) IMPLANT
SYSTEM BAG RETRIEVAL 10MM (BASKET) IMPLANT
TOWEL OR 17X26 10 PK STRL BLUE (TOWEL DISPOSABLE) ×1 IMPLANT
TRAY LAPAROSCOPIC (CUSTOM PROCEDURE TRAY) ×1 IMPLANT
TROCAR 11X100 Z THREAD (TROCAR) ×1 IMPLANT
TROCAR BALLN 12MMX100 BLUNT (TROCAR) ×1 IMPLANT
TROCAR Z-THREAD OPTICAL 5X100M (TROCAR) ×1 IMPLANT

## 2023-04-26 NOTE — Anesthesia Procedure Notes (Signed)
 Procedure Name: Intubation Date/Time: 04/26/2023 11:14 AM  Performed by: Vanessa Oneida, CRNAPre-anesthesia Checklist: Patient identified, Emergency Drugs available, Suction available and Patient being monitored Patient Re-evaluated:Patient Re-evaluated prior to induction Oxygen Delivery Method: Circle system utilized Preoxygenation: Pre-oxygenation with 100% oxygen Induction Type: IV induction Ventilation: Mask ventilation without difficulty Laryngoscope Size: 2 and Miller Grade View: Grade I Tube type: Oral Tube size: 7.0 mm Number of attempts: 1 Airway Equipment and Method: Stylet Placement Confirmation: ETT inserted through vocal cords under direct vision, positive ETCO2 and breath sounds checked- equal and bilateral Secured at: 21 cm Tube secured with: Tape Dental Injury: Teeth and Oropharynx as per pre-operative assessment

## 2023-04-26 NOTE — Anesthesia Postprocedure Evaluation (Signed)
 Anesthesia Post Note  Patient: Chelsea Lam. Barca  Procedure(s) Performed: LAPAROSCOPIC CHOLECYSTECTOMY WITH ICG DYE (Abdomen)     Patient location during evaluation: PACU Anesthesia Type: General Level of consciousness: awake and alert Pain management: pain level controlled Vital Signs Assessment: post-procedure vital signs reviewed and stable Respiratory status: spontaneous breathing, nonlabored ventilation, respiratory function stable and patient connected to nasal cannula oxygen Cardiovascular status: blood pressure returned to baseline and stable Postop Assessment: no apparent nausea or vomiting Anesthetic complications: no   No notable events documented.  Last Vitals:  Vitals:   04/26/23 1415 04/26/23 1505  BP: (!) 136/92 130/81  Pulse: 67 79  Resp: 17 16  Temp: 36.6 C 36.8 C  SpO2: 97% 95%    Last Pain:  Vitals:   04/26/23 1558  TempSrc:   PainSc: 5                  Verenise Moulin P Lawerence Dery

## 2023-04-26 NOTE — Transfer of Care (Signed)
 Immediate Anesthesia Transfer of Care Note  Patient: Chelsea Lam. Alpert  Procedure(s) Performed: LAPAROSCOPIC CHOLECYSTECTOMY WITH ICG DYE (Abdomen)  Patient Location: PACU  Anesthesia Type:General  Level of Consciousness: awake and patient cooperative  Airway & Oxygen Therapy: Patient Spontanous Breathing and Patient connected to nasal cannula oxygen  Post-op Assessment: Report given to RN and Post -op Vital signs reviewed and stable  Post vital signs: Reviewed and stable  Last Vitals:  Vitals Value Taken Time  BP 112/80 04/26/23 1208  Temp    Pulse 72 04/26/23 1210  Resp 12 04/26/23 1210  SpO2 100 % 04/26/23 1210  Vitals shown include unfiled device data.  Last Pain:  Vitals:   04/26/23 1006  TempSrc:   PainSc: 3          Complications: No notable events documented.

## 2023-04-26 NOTE — TOC Initial Note (Signed)
 Transition of Care (TOC) - Initial/Assessment Note    Patient Details  Name: Chelsea Lam. Chelsea Lam MRN: [id number] Date of Birth: 03-Apr-1980  Transition of Care [facility] CM/SW Contact:    Adrian Prows, RN Phone Number: 04/26/2023, 1:47 PM  Clinical Narrative:                 [place] Monday w/ pt in room; pt says she lives at home w/ her spouse Chelsea Lam [phone]); she plans to return at d/c; pt says her neighbor will provide transportation; pt confirms Insurance/PCP; she denies SDOH risks; pt says she does not have DME, HH services, or home oxygen; no TOC needs; TOC signing off; please place consult if needed.  Expected Discharge Plan: Home/Self Care Barriers to Discharge: No Barriers Identified   Patient Goals and CMS Choice Patient states their goals for this hospitalization and ongoing recovery are:: home CMS Medicare.gov Compare Post Acute Care list provided to:: Patient        Expected Discharge Plan and Services   Discharge Planning Services: CM Consult   Living arrangements for the past 2 months: Single Family Home Expected Discharge Date: 04/26/23                                    Prior Living Arrangements/Services Living arrangements for the past 2 months: Single Family Home Lives with:: Spouse Patient language and need for interpreter reviewed:: Yes Do you feel safe going back to the place where you live?: Yes      Need for Family Participation in Patient Care: Yes (Comment) Care giver support system in place?: Yes (comment) Current home services:  (n/a) Criminal Activity/Legal Involvement Pertinent to Current Situation/Hospitalization: No - Comment as needed  Activities of Daily Living   ADL Screening (condition at time of admission) Independently performs ADLs?: Yes (appropriate for developmental age) Is the patient deaf or have difficulty hearing?: No Does the patient have difficulty seeing, even when wearing glasses/contacts?: No Does the  patient have difficulty concentrating, remembering, or making decisions?: No  Permission Sought/Granted Permission sought to share information with : Case Manager Permission granted to share information with : Yes, Verbal Permission Granted  Share Information with NAME: Case Manager     Permission granted to share info w Relationship: Chelsea Lam (spouse) [phone]     Emotional Assessment Appearance:: Appears stated age Attitude/Demeanor/Rapport: Gracious Affect (typically observed): Accepting Orientation: : Oriented to Self, Oriented to Place, Oriented to  Time, Oriented to Situation Alcohol / Substance Use: Not Applicable Psych Involvement: No (comment)  Admission diagnosis:  Biliary colic [K80.50] Biliary colic symptom [O53.66] Patient Active Problem List   Diagnosis Date Noted   Biliary colic symptom 44/04/4740   Status post repeat low transverse cesarean section 07/21/2014   Encounter for trial of labor 07/20/2014   PCP:  Maud Deed, PA Pharmacy:   [id number] - [place], [place] - [address] [place] [address] Phone: [phone] Fax: [phone]  [id number] - [place], [place] - [address] AT [address] [place] [address] Phone: [phone] Fax: [phone]     Social Drivers of Health (SDOH) Social History: SDOH Screenings   Food Insecurity: No Food Insecurity (04/26/2023)  Housing: Low Risk  (04/26/2023)  Transportation Needs: No Transportation Needs (04/26/2023)  Utilities: Not At Risk (04/26/2023)  Social Connections: Unknown (06/22/2021)   [facility] from [facility],  [facility]  Tobacco Use: Medium Risk (04/25/2023)   SDOH Interventions: Food Insecurity Interventions: Intervention Not Indicated, Inpatient TOC Housing Interventions: Intervention Not Indicated, Inpatient TOC Transportation Interventions: Intervention Not Indicated, Inpatient  TOC Utilities Interventions: Intervention Not Indicated, Inpatient TOC   Readmission Risk Interventions     No data to display

## 2023-04-26 NOTE — Progress Notes (Signed)
Assessment unchanged. Pt verbalized understanding  of dc instructions including medications and follow up care. Discharged via wc to front entrance accompanied by NT. 

## 2023-04-26 NOTE — Discharge Instructions (Signed)
 [facility] ______[facility] LAPAROSCOPIC SURGERY: POST OP INSTRUCTIONS Always review your discharge instruction sheet given to you by the facility where your surgery was performed. IF YOU HAVE DISABILITY OR FAMILY LEAVE FORMS, YOU MUST BRING THEM TO THE OFFICE FOR PROCESSING.   DO NOT GIVE THEM TO YOUR DOCTOR.  A prescription for pain medication may be given to you upon discharge.  Take your pain medication as prescribed, if needed.  If narcotic pain medicine is not needed, then you may take acetaminophen (Tylenol) or ibuprofen (Advil) as needed. Take your usually prescribed medications unless otherwise directed. If you need a refill on your pain medication, please contact your pharmacy.  They will contact our office to request authorization. Prescriptions will not be filled after 5pm or on week-ends. You should follow a light diet the first few days after arrival home, such as soup and crackers, etc.  Be sure to include lots of fluids daily. Most patients will experience some swelling and bruising in the area of the incisions.  Ice packs will help.  Swelling and bruising can take several days to resolve.  It is common to experience some constipation if taking pain medication after surgery.  Increasing fluid intake and taking a stool softener (such as Colace) will usually help or prevent this problem from occurring.  A mild laxative (Milk of Magnesia or Miralax) should be taken according to package instructions if there are no bowel movements after 48 hours. Unless discharge instructions indicate otherwise, you may remove your bandages 24-48 hours after surgery, and you may shower at that time.  You may have steri-strips (small skin tapes) in place directly over the incision.  These strips should be left on the skin for 7-10 days.  If your surgeon used skin glue on the incision, you may shower in 24 hours.  The glue will flake off over the next 2-3 weeks.  Any sutures or staples will be  removed at the office during your follow-up visit. ACTIVITIES:  You may resume regular (light) daily activities beginning the next day--such as daily self-care, walking, climbing stairs--gradually increasing activities as tolerated.  You may have sexual intercourse when it is comfortable.  Refrain from any heavy lifting or straining until approved by your doctor. You may drive when you are no longer taking prescription pain medication, you can comfortably wear a seatbelt, and you can safely maneuver your car and apply brakes. RETURN TO WORK:  __________________________________________________________ Bonita Quin should see your doctor in the office for a follow-up appointment approximately 2-3 weeks after your surgery.  Make sure that you call for this appointment within a day or two after you arrive home to insure a convenient appointment time. OTHER INSTRUCTIONS: __________________________________________________________________________________________________________________________ __________________________________________________________________________________________________________________________ WHEN TO CALL YOUR DOCTOR: Fever over 101.0 Inability to urinate Continued bleeding from incision. Increased pain, redness, or drainage from the incision. Increasing abdominal pain  The clinic staff is available to answer your questions during regular business hours.  Please don't hesitate to call and ask to speak to one of the nurses for clinical concerns.  If you have a medical emergency, go to the nearest emergency room or call 911.  A surgeon from [facility] is always on call at the hospital. [address], [address], [place], [place]  [address] ? [address] [address], [place], [place]   [address] [phone] ? [phone] ? FAX [phone] Web site: [email or web address]

## 2023-04-26 NOTE — Op Note (Signed)
 Laparoscopic Cholecystectomy with ICG cholangiography  Indications: This patient presents with early acute calculous cholecystitis and will undergo laparoscopic cholecystectomy.  Pre-operative Diagnosis: early acute calculous cholecystitis  Post-operative Diagnosis: Same  Surgeon: Almond Lint   Assistants: n/a  Anesthesia: General endotracheal anesthesia and local  ASA Class: 2  Procedure Details   The patient was seen again in the Holding Room. The risks, benefits, complications, treatment options, and expected outcomes were discussed with the patient. The possibilities of  bleeding, recurrent infection, damage to nearby structures, the need for additional procedures, failure to diagnose a condition, the possible need to convert to an open procedure, and creating a complication requiring transfusion or operation were discussed with the patient. The likelihood of improving the patient's symptoms with return to their baseline status is good.    The patient and/or family concurred with the proposed plan, giving informed consent. The site of surgery properly noted. The patient was taken to OR # 1 and placed supine on the OR table.  SCDs were placed. Antibiotic prophylaxis was administered. General endotracheal anesthesia was then administered and tolerated well. After the induction, the abdomen was prepped with Chloraprep and draped in the sterile fashion. and the procedure verified as Laparoscopic Cholecystectomy with possible Intraoperative Cholangiogram. A Time Out was held and the above information confirmed.  Local anesthetic agent was injected into the skin near the umbilicus and a vertical incision was made with a #11 blade. I dissected down to the abdominal fascia with blunt dissection.  The fascia was incised vertically and the peritoneal cavity was entered bluntly.  A pursestring suture of 0-Vicryl was placed around the fascial opening.  The Hasson cannula was inserted and secured with  the stay suture.  Pneumoperitoneum was then created with CO2 and tolerated well without any adverse changes in the patient's vital signs. An 11-mm port was placed in the subxiphoid position.  Two 5-mm ports were placed in the right upper quadrant. All skin incisions were infiltrated with a local anesthetic agent before making the incision and placing the trocars.   The patient was placed into reverse Trendelenburg position and was tilted slightly to the patient's left.  The gallbladder was identified, and there were no significant adhesions noted.    The fundus of the gallbladder was then grasped and retracted cephalad.  The infundibulum was grasped and retracted laterally, exposing the peritoneum overlying the triangle of Calot. The cystic duct and cystic artery were identified.  These were both skeletonized.  A window between the gallbladder and the liver was obtained to ensure a critical view.    Near IR light was used to evaluate the ICG. The common duct and cystic duct were visible.  The cystic duct was then clipped once on the specimen side and thrice on the patient side.  The cystic artery was clipped once on the patient side and doubly clipped on the patient side.    The gallbladder was dissected from the liver bed in retrograde fashion with the electrocautery. The gallbladder was removed and placed in a retrieval bag.  The gallbladder and bag were then removed through the umbilical port site.  The liver bed was irrigated and inspected. Hemostasis was achieved with the electrocautery. Copious irrigation was utilized and was repeatedly aspirated until clear.    Pneumoperitoneum was released as we removed the trocars.   The pursestring suture was used to close the umbilical fascia.  One additional 0-0 vicryl was needed.    4-0 Monocryl was used to close the  skin in subcuticular fashion.   The skin was cleaned and dry, and Dermabond was applied. The patient was then extubated and brought to the  recovery room in stable condition. Instrument, sponge, and needle counts were correct at closure and at the conclusion of the case.   Findings: Early acute inflammation.    Estimated Blood Loss: min         Drains: none          Specimens: Gallbladder to pathology       Complications: None; patient tolerated the procedure well.         Disposition: PACU - hemodynamically stable.         Condition: stable

## 2023-04-26 NOTE — Anesthesia Preprocedure Evaluation (Addendum)
 Anesthesia Evaluation  Patient identified by MRN, date of birth, ID band Patient awake    Reviewed: Allergy & Precautions, NPO status , Patient's Chart, lab work & pertinent test results  Airway Mallampati: II  TM Distance: >3 FB Neck ROM: Full    Dental no notable dental hx.    Pulmonary former smoker   Pulmonary exam normal        Cardiovascular negative cardio ROS  Rhythm:Regular Rate:Normal     Neuro/Psych negative neurological ROS  negative psych ROS   GI/Hepatic Neg liver ROS,,,Cholecystitis    Endo/Other  negative endocrine ROS    Renal/GU negative Renal ROS  negative genitourinary   Musculoskeletal negative musculoskeletal ROS (+)    Abdominal Normal abdominal exam  (+)   Peds  Hematology negative hematology ROS (+)   Anesthesia Other Findings   Reproductive/Obstetrics                             Anesthesia Physical Anesthesia Plan  ASA: 2  Anesthesia Plan: General   Post-op Pain Management: Tylenol PO (pre-op)*   Induction: Intravenous  PONV Risk Score and Plan: 3 and Ondansetron, Dexamethasone, Midazolam, Treatment may vary due to age or medical condition and Scopolamine patch - Pre-op  Airway Management Planned: Mask and Oral ETT  Additional Equipment: None  Intra-op Plan:   Post-operative Plan: Extubation in OR  Informed Consent: I have reviewed the patients History and Physical, chart, labs and discussed the procedure including the risks, benefits and alternatives for the proposed anesthesia with the patient or authorized representative who has indicated his/her understanding and acceptance.     Dental advisory given  Plan Discussed with: CRNA  Anesthesia Plan Comments:        Anesthesia Quick Evaluation

## 2023-04-26 NOTE — Interval H&P Note (Signed)
 History and Physical Interval Note:  04/26/2023 11:00 AM  Chelsea Lam  has presented today for surgery, with the diagnosis of CHOLECYSTITIS.  The various methods of treatment have been discussed with the patient and family. After consideration of risks, benefits and other options for treatment, the patient has consented to  Procedure(s): LAPAROSCOPIC CHOLECYSTECTOMY (N/A) as a surgical intervention.  The patient's history has been reviewed, patient examined, no change in status, stable for surgery.  I have reviewed the patient's chart and labs.  Questions were answered to the patient's satisfaction.     Almond Lint

## 2023-04-27 ENCOUNTER — Encounter (HOSPITAL_COMMUNITY): Payer: Self-pay | Admitting: General Surgery

## 2023-04-28 ENCOUNTER — Ambulatory Visit: Payer: Self-pay | Admitting: General Surgery

## 2023-04-28 LAB — SURGICAL PATHOLOGY

## 2023-04-30 NOTE — Discharge Summary (Signed)
 Physician Discharge Summary  Patient ID: Chelsea Lam. [place] MRN: [id number] DOB/AGE: 07/06/1980 43 y.o.  Admit date: 04/25/2023 Discharge date: 04/30/2023  Admission Diagnoses: Patient Active Problem List   Diagnosis Date Noted   Biliary colic symptom 40/98/1191   Status post repeat low transverse cesarean section 07/21/2014   Encounter for trial of labor 07/20/2014    Discharge Diagnoses:  Principal Problem:   Biliary colic symptom And same as above  Discharged Condition: stable  Hospital Course:  Patient was admitted with mild acute cholecystitis. She underwent lap chole with ICG 04/26/23 Donell Beers) which was uneventful.  She exhibited good pain control. She was ambulatory and tolerating PO. She was discharged in stable condition.   Consults: None  Significant Diagnostic Studies: none new  Treatments: surgery: see above  Discharge Exam: Blood pressure 130/81, pulse 79, temperature 98.2 F (36.8 C), temperature source Oral, resp. rate 16, height 5\' 2"  (1.575 m), weight 90.7 kg, SpO2 95%, unknown if currently breastfeeding. General appearance: alert, cooperative, and no distress Resp: breathing comfortably GI: soft approp tender, non distended, incisions c/d/i  Disposition: Discharge disposition: 01-Home or Self Care       Discharge Instructions     Call MD for:  difficulty breathing, headache or visual disturbances   Complete by: As directed    Call MD for:  hives   Complete by: As directed    Call MD for:  persistant nausea and vomiting   Complete by: As directed    Call MD for:  redness, tenderness, or signs of infection (pain, swelling, redness, odor or green/yellow discharge around incision site)   Complete by: As directed    Call MD for:  severe uncontrolled pain   Complete by: As directed    Call MD for:  temperature >100.4   Complete by: As directed    Diet - low sodium heart healthy   Complete by: As directed    Increase activity slowly   Complete by:  As directed       Allergies as of 04/26/2023       Reactions   Tetracyclines & Related Hives, Rash        Medication List     TAKE these medications    amoxicillin-clavulanate 875-125 MG tablet Commonly known as: AUGMENTIN Take 1 tablet by mouth 2 (two) times daily.   amphetamine-dextroamphetamine 30 MG 24 hr capsule Commonly known as: ADDERALL XR Take 30 mg by mouth daily.   docusate sodium 100 MG capsule Commonly known as: COLACE Take 1 capsule (100 mg total) by mouth 2 (two) times daily.   escitalopram 10 MG tablet Commonly known as: LEXAPRO Take 5 mg by mouth daily.   famotidine 20 MG tablet Commonly known as: PEPCID Take 20 mg by mouth daily as needed for heartburn.   methocarbamol 500 MG tablet Commonly known as: ROBAXIN Take 1 tablet (500 mg total) by mouth every 8 (eight) hours as needed for muscle spasms.   ondansetron 4 MG disintegrating tablet Commonly known as: ZOFRAN-ODT Take 1 tablet (4 mg total) by mouth every 8 (eight) hours as needed.   oxyCODONE 5 MG immediate release tablet Commonly known as: Roxicodone Take 1 tablet (5 mg total) by mouth every 6 (six) hours as needed for severe pain (pain score 7-10) or moderate pain (pain score 4-6). What changed: reasons to take this        Follow-up Information     Maczis, Hedda Slade, PA-C Follow up in 3 week(s).   Specialty: General  Surgery Contact information: [address] [facility] [place] [address] [phone]                 Signed: Almond Lint 04/30/2023, 9:07 AM

## 2023-05-01 ENCOUNTER — Encounter (HOSPITAL_COMMUNITY): Admission: RE | Payer: Self-pay | Source: Home / Self Care

## 2023-05-01 ENCOUNTER — Ambulatory Visit (HOSPITAL_COMMUNITY): Admission: RE | Admit: 2023-05-01 | Source: Home / Self Care | Admitting: General Surgery

## 2023-05-01 SURGERY — LAPAROSCOPIC CHOLECYSTECTOMY
Anesthesia: General
